# Patient Record
Sex: Female | Born: 1990 | Race: White | Hispanic: No | Marital: Single | Smoking: Former smoker
Health system: Southern US, Community
[De-identification: ages and names within clinical notes are randomized; demographics above are authoritative.]

## PROBLEM LIST (undated history)

## (undated) ENCOUNTER — Inpatient Hospital Stay: Payer: Self-pay

## (undated) DIAGNOSIS — Z9189 Other specified personal risk factors, not elsewhere classified: Secondary | ICD-10-CM

## (undated) DIAGNOSIS — F419 Anxiety disorder, unspecified: Secondary | ICD-10-CM

## (undated) HISTORY — PX: TUBAL LIGATION: SHX77

---
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
## 2012-01-02 LAB — PATHOLOGY REPORT

## 2012-03-15 ENCOUNTER — Observation Stay: Payer: Self-pay

## 2012-03-15 LAB — URINALYSIS, COMPLETE
Ketone: NEGATIVE
Nitrite: NEGATIVE
Ph: 8 (ref 4.5–8.0)
Protein: NEGATIVE
RBC,UR: 5 /HPF (ref 0–5)
Specific Gravity: 1.008 (ref 1.003–1.030)
WBC UR: 6 /HPF (ref 0–5)

## 2012-03-17 LAB — URINE CULTURE

## 2012-05-25 ENCOUNTER — Inpatient Hospital Stay: Payer: Self-pay | Admitting: Internal Medicine

## 2012-05-25 LAB — CBC WITH DIFFERENTIAL/PLATELET
Basophil %: 0.6 %
Lymphocyte %: 22 %
MCV: 88 fL (ref 80–100)
Monocyte %: 4.5 %
Neutrophil #: 11.6 10*3/uL — ABNORMAL HIGH (ref 1.4–6.5)
Neutrophil %: 72.6 %
Platelet: 120 10*3/uL — ABNORMAL LOW (ref 150–440)
RBC: 4.06 10*6/uL (ref 3.80–5.20)
RDW: 12.9 % (ref 11.5–14.5)
WBC: 16 10*3/uL — ABNORMAL HIGH (ref 3.6–11.0)

## 2014-12-03 NOTE — Op Note (Signed)
PATIENT NAME:  Tracie Meyer, Tracie Meyer MR#:  [id number] DATE OF BIRTH:  March 31, 1991  DATE OF PROCEDURE:  12/31/2011  PREOPERATIVE DIAGNOSIS: Acute appendicitis.   POSTOPERATIVE DIAGNOSIS: Normal appendix.   OPERATION PERFORMED: Laparoscopic appendectomy.   SURGEON: Claude MangesWilliam F. La Dibella, MD  PROCEDURE IN DETAIL: The patient was placed supine and in slight head down position and slight rotation to the right position as she was [redacted] weeks pregnant and she was prepped and draped in the usual sterile fashion. A 1 inch midline incision was made in the epigastrium and this was carried down through the linea alba and then the peritoneum was entered carefully. A Hassan cannula was introduced amidst horizontal mattress sutures of 0 Vicryl and a 12 mmHg CO2 pneumoperitoneum was created. Two additional 5 mm trocars were placed under direct visualization. The uterus was gravid and the appendix was found and appeared completely normal. The mesoappendix was taken down with the Harmonic scalpel and the appendectomy was performed with an Endo GIA stapler. The appendix was placed in an Endo Catch bag and extracted from the abdomen via the epigastric port. There was a small amount of bleeding from the appendicular artery where it had been stapled and this was controlled with two large hemoclips. Another cause for the patient's abdominal pain was searched for and the gallbladder appeared normal. The duodenum appeared normal. The small intestine was run from the ileocecal valve back at least 3 or 4 feet and it appeared normal other than the fact that the terminal ileum was more dilated than usual. There was certainly no Meckel's diverticulum or other pathologies such as Crohn's disease or creeping fat. The uterus was gravid and both ovaries were identified and both were normal; neither were torsed and there was no evidence of salpingitis. Therefore a cause for the patient's abdominal pain was not found. The peritoneum was desufflated and  decannulated and the linea alba was closed with a running 0 PDS suture and the previously placed Vicryls were tied one to another. All three skin sites were closed with subcuticular 5-0 Monocryl and suture strips. The patient tolerated the procedure well. There were no complications. ____________________________ Claude MangesWilliam F. Jakya Dovidio, MD wfm:cms D: 12/31/2011 10:26:08 ET T: 12/31/2011 11:21:32 ET  JOB#: [id number] Claude MangesWILLIAM F Sherlock Nancarrow MD ELECTRONICALLY SIGNED 01/01/2012 7:54

## 2014-12-03 NOTE — H&P (Signed)
Subjective/Chief Complaint nausea and ap    History of Present Illness 24 year old female at [redacted] weeks EGA with first pregnancy awoke this am with nausea and emesis of 6 times,  developed acute onset rlq pain around 7 pm.  Work-up in ER with abdominal US and OB US unremarkable.  WBC 13K lytes ok. UA negative    Past History none    Primary Physician [facility]   Past Med/Surgical Hx:  None, patient reports no medical history.:   None, patient reports no surgical history.:   ALLERGIES:  No Known Allergies:   HOME MEDICATIONS: Medication Instructions Status  Prenatal 1 oral capsule 1 cap(s) orally once a day Active   Family and Social History:   Family History Non-Contributory    Social History negative tobacco, negative ETOH   Review of Systems:   Subjective/Chief Complaint see above    Abdominal Pain Yes    Tolerating Diet No  Nauseated  Vomiting  hungry.    Medications/Allergies Reviewed Medications/Allergies reviewed   Physical Exam:   GEN no acute distress, thin    HEENT pale conjunctivae    NECK supple    RESP normal resp effort    CARD regular rate    ABD positive tenderness  no hernia  soft  rovsing's sign present, tender in RLQ.    LYMPH negative neck    EXTR negative cyanosis/clubbing    SKIN normal to palpation    NEURO cranial nerves intact    PSYCH A+O to time, place, person   Routine Chem:  21-May-13 19:08    Glucose, Serum 77   BUN 10   Creatinine (comp) 0.51   Sodium, Serum 137   Potassium, Serum 3.7   Chloride, Serum 105   CO2, Serum 20   Calcium (Total), Serum 9.4  Hepatic:  21-May-13 19:08    Bilirubin, Total 0.5   Alkaline Phosphatase 55   SGPT (ALT) 22   SGOT (AST) 20   Total Protein, Serum 7.2   Albumin, Serum 3.7  Routine Chem:  21-May-13 19:08    Osmolality (calc) 272   eGFR (African American) >60   eGFR (Non-African American) >60   Anion Gap 12  Routine Hem:  21-May-13 19:08    WBC (CBC) 13.6   RBC  (CBC) 4.10   Hemoglobin (CBC) 12.3   Hematocrit (CBC) 37.2   Platelet Count (CBC) 158   MCV 91   MCH 30.0   MCHC 33.1   RDW 13.1  Routine Chem:  21-May-13 19:08    Lipase 569   HCG Betasubunit Quant. Serum 26445  Routine UA:  21-May-13 22:26    Color (UA) Yellow   Clarity (UA) Hazy   Glucose (UA) Negative   Bilirubin (UA) Negative   Ketones (UA) 2+   Specific Gravity (UA) 1.021   Blood (UA) Negative   pH (UA) 7.0   Protein (UA) Negative   Nitrite (UA) Negative   Leukocyte Esterase (UA) Negative   RBC (UA) 1 /HPF   WBC (UA) <1 /HPF   Epithelial Cells (UA) 2 /HPF   Mucous (UA) PRESENT   Radiology Results: [place]:    21-May-13 21:48, US Abdomen General Survey   US Abdomen General Survey   REASON FOR EXAM:    right sided abdominal pain  COMMENTS:   May transport without cardiac monitor    PROCEDURE: [place]  - US ABDOMEN GENERAL SURVEY  - Dec 30 2011  9:48PM     RESULT: The  liver exhibits normal echotexture with no focal mass nor   ductal dilation. Portal venous flow is normal in direction toward the   liver. The gallbladder is adequately distended with no evidence of   stones, wall thickening, or pericholecystic fluid. The common bile duct   is normal at just under 2 mm in diameter. The pancreatic head and body   are normal in appearance. The tail could not be demonstrated due to the   presence of bowel gas.    The spleen, abdominal aorta, inferior vena cava, and kidneys are normal   in appearance. There is no hydronephrosis. No ascites is demonstrated.  IMPRESSION:  Normal abdominal ultrasound examination.          Verified By: DAVID A. Martinique, M.D., MD    21-May-13 22:30, US OB Greater or Equal to 14 weeks   US OB Greater or Equal to 14 weeks   REASON FOR EXAM:    abd pain  COMMENTS:       PROCEDURE: [place]  - US OB GREATER/OR EQUAL TO 14WKS  - Dec 30 2011 10:30PM     RESULT: There is a gravid uterus present. The presentation of the fetus   is variable/breech. The  placenta is anterior and partially fundal. The   distance from the inferior margin of the placenta to the internal   cervical os is 6.9 cm. The fetal cardiac rate of 160 beats per minute was   demonstrated. A 4 chambered heart was demonstrated. The fetal stomach,   kidneys, and urinary bladder are normal in appearance. The intracranial   structures and the spinal structures are normal in appearance.    Measured parameters:  BPD 3.59 cm corresponding to an EGA of [redacted] weeks 0 days  HC 13.66 cm corresponding to an EGA of [redacted] weeks one day  AC 11.47 cm corresponding to an EGA of [redacted] weeks 2 days  FL 2.30 cm corresponding to an EGA of [redacted] weeks 6 days  HL 2.23 cm corresponding to an EGA of [redacted] weeks 6 days  The estimated fetal weight is 191 grams +/- 27 grams.    IMPRESSION:  There is a viable IUP with estimated gestational age of [redacted]   weeks 0 days +/- 12 days. The estimated date of confinement is 08 June 2012. This is in good agreement with the patient's clinical dating. No   fetal anomalies are demonstrated.          Verified By: DAVID A. Martinique, M.D., MD     Assessment/Admission Diagnosis 24 year old female in 2nd tri-mester with acute onset abdominal pain in rlq,  not convinced this is appendicitis at this time    Plan admit, hydrate, IV mefoxitin, repeat cbc and abdominal exam in next 6-8 hours.  discussed with patient and boyfriend in detail.   Electronic Signatures: Sherri Rad (MD)  (Signed 21-May-13 23:40)  Authored: CHIEF COMPLAINT and HISTORY, PAST MEDICAL/SURGIAL HISTORY, ALLERGIES, HOME MEDICATIONS, FAMILY AND SOCIAL HISTORY, REVIEW OF SYSTEMS, PHYSICAL EXAM, LABS, Radiology, ASSESSMENT AND PLAN   Last Updated: 21-May-13 23:40 by Sherri Rad (MD)

## 2014-12-19 NOTE — H&P (Signed)
L&D Evaluation:  History Expanded:   HPI 24 yo G1 at 27 weeks presents with c/o suprapubic pressure. [facility] No prenatal records available currently. History notable for appendectomy around [redacted] weeks gestation. Denies VF, LOF or contractions. +FM    Patient's Medical History No Chronic Illness    Patient's Surgical History Appendectomy    Medications Pre Natal Vitamins    Allergies NKDA   ROS:   ROS see HPI   Exam:   Vital Signs stable    Urine Protein UA: 3+ leuk, 19 Epi, 6 WBC, Trace bacteria, neg nitrite, neg ketones, neg blood    General tearful    Mental Status clear    Chest clear    Heart no murmur/gallop/rubs    Abdomen gravid, non-tender, except tender suprapubic area    Edema no edema    Pelvic no external lesions, cervix closed and thick    Mebranes Intact    FHT appropriate for gestational age, very active fetus    Ucx absent    Skin dry   Impression:   Impression IUP at 27 weeks with suprapubic pain   Plan:   Comments Kidney/bladder US Macrobid - give 1 dose tonight, remainder of medication per Rx    Follow Up Appointment already scheduled. 8/12   Electronic Signatures: Vella KohlerBrothers, Ieisha Gao K (CNM)  (Signed 05-Aug-13 19:46)  Authored: L&D Evaluation   Last Updated: 05-Aug-13 19:46 by Vella KohlerBrothers, Natina Wiginton K (CNM)

## 2015-03-29 ENCOUNTER — Encounter: Payer: Medicaid Other | Admitting: Obstetrics and Gynecology

## 2015-08-12 NOTE — L&D Delivery Note (Signed)
Delivery Note At 11:55 PM a viable female was delivered via Vaginal, Spontaneous Delivery (Presentation: OA).  APGAR: 9, 9; weight  pending.   Placenta status: delivered spontaneous, intact.  Cord: 3vc with the following complications: none.  Cord pH: n/a  Anesthesia:  epidural Episiotomy: None Lacerations: None Suture Repair: n/a Est. Blood Loss (mL): 400  Mom to postpartum.  Baby to Couplet care / Skin to Skin.  Called to see patient.  Mom pushed to deliver a viable female infant.  The head followed by shoulders, which delivered without difficulty, and the rest of the body.  A single, tight, nuchal cord noted.  Baby to mom's chest.  Cord clamped and cut after > 1 min delay.  Cord blood obtained.  Placenta delivered spontaneously, intact, with a 3-vessel cord.  No vaginal, cervical, or perineal lacerations.  All counts correct.  Hemostasis obtained with IV pitocin and fundal massage. EBL 400 mL.    Thomasene MohairStephen Tayte Childers, MD 05/30/2016 12:44 AM

## 2015-08-16 ENCOUNTER — Encounter: Payer: Self-pay | Admitting: Obstetrics and Gynecology

## 2015-08-16 ENCOUNTER — Ambulatory Visit (INDEPENDENT_AMBULATORY_CARE_PROVIDER_SITE_OTHER): Payer: Medicaid Other | Admitting: Obstetrics and Gynecology

## 2015-08-16 VITALS — BP 107/50 | HR 81 | Ht 67.0 in | Wt 107.7 lb

## 2015-08-16 DIAGNOSIS — Z30013 Encounter for initial prescription of injectable contraceptive: Secondary | ICD-10-CM

## 2015-08-16 DIAGNOSIS — Z720 Tobacco use: Secondary | ICD-10-CM | POA: Diagnosis not present

## 2015-08-16 DIAGNOSIS — Z124 Encounter for screening for malignant neoplasm of cervix: Secondary | ICD-10-CM | POA: Diagnosis not present

## 2015-08-16 DIAGNOSIS — Z01419 Encounter for gynecological examination (general) (routine) without abnormal findings: Secondary | ICD-10-CM | POA: Diagnosis not present

## 2015-08-16 MED ORDER — MEDROXYPROGESTERONE ACETATE 150 MG/ML IM SUSP: 150.0000 mg | INTRAMUSCULAR | Status: DC

## 2015-08-16 NOTE — Patient Instructions (Signed)
RE: MyChart  Dear Ms. Goodness  We are excited to introduce MyChart, a new best-in-class service that provides you online access to important information in your electronic medical record. We want to make it easier for you to view your health information - all in one secure location - when and where you need it. We expect MyChart will enhance the quality of care and service we provide. Use the activation code below to enroll in MyChart online at [email or web address]  When you register for MyChart, you can:  Marland Kitchen View your test results. . Communicate securely with your physician's office.  . View your medical history, allergies, medications, and immunizations. . Conveniently print information such as your medication lists.  If you are age 23 or older and want a member of your family to have access to your record, you must provide written consent by completing a proxy form available at our facility. Please speak to our clinical staff about guidelines regarding accounts for patients younger than age 23.  As you activate your MyChart account and need any technical assistance, please call the MyChart technical support line at [phone]) or email your question to [email or web address]. If you email your question(s), please include your name, a return phone number and the best time to reach you.  Thank you for using MyChart as your new health and wellness resource!  MyChart Activation Code:  [email or web address] Expires: 09/14/2015 11:20 AM          Health Maintenance, Female Adopting a healthy lifestyle and getting preventive care can go a long way to promote health and wellness. Talk with your health care provider about what schedule of regular examinations is right for you. This is a good chance for you to check in with your provider about disease prevention and staying healthy. In between checkups, there are plenty of things you can do on your own. Experts have done  a lot of research about which lifestyle changes and preventive measures are most likely to keep you healthy. Ask your health care provider for more information. WEIGHT AND DIET  Eat a healthy diet  Be sure to include plenty of vegetables, fruits, low-fat dairy products, and lean protein.  Do not eat a lot of foods high in solid fats, added sugars, or salt.  Get regular exercise. This is one of the most important things you can do for your health.  Most adults should exercise for at least 150 minutes each week. The exercise should increase your heart rate and make you sweat (moderate-intensity exercise).  Most adults should also do strengthening exercises at least twice a week. This is in addition to the moderate-intensity exercise.  Maintain a healthy weight  Body mass index (BMI) is a measurement that can be used to identify possible weight problems. It estimates body fat based on height and weight. Your health care provider can help determine your BMI and help you achieve or maintain a healthy weight.  For females 23 years of age and older:   A BMI below 18.5 is considered underweight.  A BMI of 18.5 to 24.9 is normal.  A BMI of 25 to 29.9 is considered overweight.  A BMI of 30 and above is considered obese.  Watch levels of cholesterol and blood lipids  You should start having your blood tested for lipids and cholesterol at 23 years of age, then have this test every 5 years.  You may need to have your cholesterol levels checked more often if:  Your lipid  or cholesterol levels are high.  You are older than 23 years of age.  You are at high risk for heart disease.  CANCER SCREENING   Lung Cancer  Lung cancer screening is recommended for adults 23-40 years old who are at high risk for lung cancer because of a history of smoking.  A yearly low-dose CT scan of the lungs is recommended for people who:  Currently smoke.  Have quit within the past 15 years.  Have at  least a 30-pack-year history of smoking. A pack year is smoking an average of one pack of cigarettes a day for 1 year.  Yearly screening should continue until it has been 15 years since you quit.  Yearly screening should stop if you develop a health problem that would prevent you from having lung cancer treatment.  Breast Cancer  Practice breast self-awareness. This means understanding how your breasts normally appear and feel.  It also means doing regular breast self-exams. Let your health care provider know about any changes, no matter how small.  If you are in your 23s or 30s, you should have a clinical breast exam (CBE) by a health care provider every 1-3 years as part of a regular health exam.  If you are 23 or older, have a CBE every year. Also consider having a breast X-ray (mammogram) every year.  If you have a family history of breast cancer, talk to your health care provider about genetic screening.  If you are at high risk for breast cancer, talk to your health care provider about having an MRI and a mammogram every year.  Breast cancer gene (BRCA) assessment is recommended for women who have family members with BRCA-related cancers. BRCA-related cancers include:  Breast.  Ovarian.  Tubal.  Peritoneal cancers.  Results of the assessment will determine the need for genetic counseling and BRCA1 and BRCA2 testing. Cervical Cancer Your health care provider may recommend that you be screened regularly for cancer of the pelvic organs (ovaries, uterus, and vagina). This screening involves a pelvic examination, including checking for microscopic changes to the surface of your cervix (Pap test). You may be encouraged to have this screening done every 3 years, beginning at age 23.  For women ages 23-65, health care providers may recommend pelvic exams and Pap testing every 3 years, or they may recommend the Pap and pelvic exam, combined with testing for human papilloma virus (HPV),  every 5 years. Some types of HPV increase your risk of cervical cancer. Testing for HPV may also be done on women of any age with unclear Pap test results.  Other health care providers may not recommend any screening for nonpregnant women who are considered low risk for pelvic cancer and who do not have symptoms. Ask your health care provider if a screening pelvic exam is right for you.  If you have had past treatment for cervical cancer or a condition that could lead to cancer, you need Pap tests and screening for cancer for at least 20 years after your treatment. If Pap tests have been discontinued, your risk factors (such as having a new sexual partner) need to be reassessed to determine if screening should resume. Some women have medical problems that increase the chance of getting cervical cancer. In these cases, your health care provider may recommend more frequent screening and Pap tests. Colorectal Cancer  This type of cancer can be detected and often prevented.  Routine colorectal cancer screening usually begins at 22 years of age  and continues through 23 years of age.  Your health care provider may recommend screening at an earlier age if you have risk factors for colon cancer.  Your health care provider may also recommend using home test kits to check for hidden blood in the stool.  A small camera at the end of a tube can be used to examine your colon directly (sigmoidoscopy or colonoscopy). This is done to check for the earliest forms of colorectal cancer.  Routine screening usually begins at age 84.  Direct examination of the colon should be repeated every 5-10 years through 23 years of age. However, you may need to be screened more often if early forms of precancerous polyps or small growths are found. Skin Cancer  Check your skin from head to toe regularly.  Tell your health care provider about any new moles or changes in moles, especially if there is a change in a mole's shape  or color.  Also tell your health care provider if you have a mole that is larger than the size of a pencil eraser.  Always use sunscreen. Apply sunscreen liberally and repeatedly throughout the day.  Protect yourself by wearing long sleeves, pants, a wide-brimmed hat, and sunglasses whenever you are outside. HEART DISEASE, DIABETES, AND HIGH BLOOD PRESSURE   High blood pressure causes heart disease and increases the risk of stroke. High blood pressure is more likely to develop in:  People who have blood pressure in the high end of the normal range (130-139/85-89 mm Hg).  People who are overweight or obese.  People who are African American.  If you are 76-69 years of age, have your blood pressure checked every 3-5 years. If you are 29 years of age or older, have your blood pressure checked every year. You should have your blood pressure measured twice--once when you are at a hospital or clinic, and once when you are not at a hospital or clinic. Record the average of the two measurements. To check your blood pressure when you are not at a hospital or clinic, you can use:  An automated blood pressure machine at a pharmacy.  A home blood pressure monitor.  If you are between 57 years and 42 years old, ask your health care provider if you should take aspirin to prevent strokes.  Have regular diabetes screenings. This involves taking a blood sample to check your fasting blood sugar level.  If you are at a normal weight and have a low risk for diabetes, have this test once every three years after 23 years of age.  If you are overweight and have a high risk for diabetes, consider being tested at a younger age or more often. PREVENTING INFECTION  Hepatitis B  If you have a higher risk for hepatitis B, you should be screened for this virus. You are considered at high risk for hepatitis B if:  You were born in a country where hepatitis B is common. Ask your health care provider which  countries are considered high risk.  Your parents were born in a high-risk country, and you have not been immunized against hepatitis B (hepatitis B vaccine).  You have HIV or AIDS.  You use needles to inject street drugs.  You live with someone who has hepatitis B.  You have had sex with someone who has hepatitis B.  You get hemodialysis treatment.  You take certain medicines for conditions, including cancer, organ transplantation, and autoimmune conditions. Hepatitis C  Blood testing is recommended  for:  Everyone born from [birth year] through 1963.  Anyone with known risk factors for hepatitis C. Sexually transmitted infections (STIs)  You should be screened for sexually transmitted infections (STIs) including gonorrhea and chlamydia if:  You are sexually active and are younger than 23 years of age.  You are older than 23 years of age and your health care provider tells you that you are at risk for this type of infection.  Your sexual activity has changed since you were last screened and you are at an increased risk for chlamydia or gonorrhea. Ask your health care provider if you are at risk.  If you do not have HIV, but are at risk, it may be recommended that you take a prescription medicine daily to prevent HIV infection. This is called pre-exposure prophylaxis (PrEP). You are considered at risk if:  You are sexually active and do not regularly use condoms or know the HIV status of your partner(s).  You take drugs by injection.  You are sexually active with a partner who has HIV. Talk with your health care provider about whether you are at high risk of being infected with HIV. If you choose to begin PrEP, you should first be tested for HIV. You should then be tested every 3 months for as long as you are taking PrEP.  PREGNANCY   If you are premenopausal and you may become pregnant, ask your health care provider about preconception counseling.  If you may become pregnant, take  400 to 800 micrograms (mcg) of folic acid every day.  If you want to prevent pregnancy, talk to your health care provider about birth control (contraception). OSTEOPOROSIS AND MENOPAUSE   Osteoporosis is a disease in which the bones lose minerals and strength with aging. This can result in serious bone fractures. Your risk for osteoporosis can be identified using a bone density scan.  If you are 72 years of age or older, or if you are at risk for osteoporosis and fractures, ask your health care provider if you should be screened.  Ask your health care provider whether you should take a calcium or vitamin D supplement to lower your risk for osteoporosis.  Menopause may have certain physical symptoms and risks.  Hormone replacement therapy may reduce some of these symptoms and risks. Talk to your health care provider about whether hormone replacement therapy is right for you.  HOME CARE INSTRUCTIONS   Schedule regular health, dental, and eye exams.  Stay current with your immunizations.   Do not use any tobacco products including cigarettes, chewing tobacco, or electronic cigarettes.  If you are pregnant, do not drink alcohol.  If you are breastfeeding, limit how much and how often you drink alcohol.  Limit alcohol intake to no more than 1 drink per day for nonpregnant women. One drink equals 12 ounces of beer, 5 ounces of wine, or 1 ounces of hard liquor.  Do not use street drugs.  Do not share needles.  Ask your health care provider for help if you need support or information about quitting drugs.  Tell your health care provider if you often feel depressed.  Tell your health care provider if you have ever been abused or do not feel safe at home.   This information is not intended to replace advice given to you by your health care provider. Make sure you discuss any questions you have with your health care provider.   Document Released: 02/10/2011 Document Revised:  08/18/2014 Document Reviewed: 06/29/2013  Elsevier Interactive Patient Education [employer]. HPV (Human Papillomavirus) Vaccine--Gardasil-9:  1. Why get vaccinated? Gardasil-9 prevents human papillomavirus (HPV) types that cause many cancers, including:  cervical cancer in females,  vaginal and vulvar cancers in females,  anal cancer in females and males,  throat cancer in females and males, and  penile cancer in males. In addition, Gardasil-9 prevents HPV types that cause genital warts in both females and males. In the [place] about 12,000 women get cervical cancer every year, and about 4,000 women die from it. Gaylyn Lambert can prevent most of these cases of cervical cancer. Vaccination is not a substitute for cervical cancer screening. This vaccine does not protect against all HPV types that can cause cervical cancer. Women should still get regular Pap tests. HPV infection usually comes from sexual contact, and most people will become infected at some point in their life. About 14 million Americans, including teens, get infected every year. Most infections will go away and not cause serious problems. But thousands of women and men get cancer and diseases from HPV. 2. HPV vaccine Gaylyn Lambert is an FDA-approved HPV vaccine. It is recommended for both males and females. It is routinely given at 51 or 23 years of age, but it may be given beginning at age 18 years through age 40 years. Three doses of Gardasil-9 are recommended with the second dose given 1-2 months after the first dose and the third dose given 6 months after the first dose. 3. Some people should not get this vaccine  Anyone who has had a severe, life-threatening allergic reaction to a dose of HPV vaccine should not get another dose.  Anyone who has a severe (life threatening) allergy to any component of HPV vaccine should not get the vaccine. Tell your doctor if you have any severe allergies that you know of, including a  severe allergy to yeast.  HPV vaccine is not recommended for pregnant women. If you learn that you were pregnant when you were vaccinated, there is no reason to expect any problems for you or your baby. Any woman who learns she was pregnant when she got Gardasil-9 vaccine is encouraged to contact the manufacturer's registry for HPV vaccination during pregnancy at [phone]. Women who are breastfeeding may be vaccinated.  If you have a mild illness, such as a cold, you can probably get the vaccine today. If you are moderately or severely ill, you should probably wait until you recover. Your doctor can advise you. 4. Risks of a vaccine reaction With any medicine, including vaccines, there is a chance of side effects. These are usually mild and go away on their own, but serious reactions are also possible. Most people who get HPV vaccine do not have any serious problems with it. Mild or moderate problems following Gardasil-9:  Reactions in the arm where the shot was given:  Soreness (about 9 people in 10)  Redness or swelling (about 1 person in 3)  Fever:  Mild (100F) (about 1 person in 10)  Moderate (102F) (about 1 person in 48)  Other problems:  Headache (about 1 person in 3) Problems that could happen after any injected vaccine:  People sometimes faint after a medical procedure, including vaccination. Sitting or lying down for about 15 minutes can help prevent fainting, and injuries caused by a fall. Tell your doctor if you feel dizzy, or have vision changes or ringing in the ears.  Some people get severe pain in the shoulder and have difficulty moving the arm  where a shot was given. This happens very rarely.  Any medication can cause a severe allergic reaction. Such reactions from a vaccine are very rare, estimated at about 1 in a million doses, and would happen within a few minutes to a few hours after the vaccination. As with any medicine, there is a very remote chance of a  vaccine causing a serious injury or death. The safety of vaccines is always being monitored. For more information, visit: http://www.aguilar.org/. 5. What if there is a serious reaction? What should I look for? Look for anything that concerns you, such as signs of a severe allergic reaction, very high fever, or unusual behavior. Signs of a severe allergic reaction can include hives, swelling of the face and throat, difficulty breathing, a fast heartbeat, dizziness, and weakness. These would usually start a few minutes to a few hours after the vaccination. What should I do? If you think it is a severe allergic reaction or other emergency that can't wait, call 9-1-1 or get to the nearest hospital. Otherwise, call your doctor. Afterward, the reaction should be reported to the "Vaccine Adverse Event Reporting System" (VAERS). Your doctor might file this report, or you can do it yourself through the VAERS web site at www.vaers.SamedayNews.es, or by calling 857-531-6361. VAERS does not give medical advice. 6. The National Vaccine Injury Compensation Program The [employer] Vaccine Injury Compensation Program (VICP) is a federal program that was created to compensate people who may have been injured by certain vaccines. Persons who believe they may have been injured by a vaccine can learn about the program and about filing a claim by calling [phone] or visiting the [facility] website at GoldCloset.com.ee. There is a time limit to file a claim for compensation. 7. How can I learn more?  Ask your health care provider. He or she can give you the vaccine package insert or suggest other sources of information.  Call your local or state health department.  Contact the Centers for Disease Control and Prevention (CDC):  Call 604-751-5625 (1-800-CDC-INFO) or  Visit CDC's website at http://sweeney-todd.com/ Vaccine Information Statement HPV Vaccine Gaylyn Lambert) 11/09/14   This information is not intended  to replace advice given to you by your health care provider. Make sure you discuss any questions you have with your health care provider.   Document Released: 02/22/2014 Document Revised: 12/12/2014 Document Reviewed: 02/22/2014 Elsevier Interactive Patient Education [employer].

## 2015-08-16 NOTE — Progress Notes (Addendum)
GYNECOLOGY ANNUAL EXAM CLINIC VISIT Subjective:    Tracie Meyer is a 25 y.o. G64P1001 female who presents to establish care and for an annual exam. Last seen at [facility] 3 years ago during pregnancy. The patient has no complaints today. The patient is sexually active (single opposite sex partner for 4 years) . GYN screening history: no prior history of gyn screening tests. The patient wears seatbelts: yes. The patient participates in regular exercise: no. Has the patient ever been transfused or tattooed?: no. The patient reports that there is not domestic violence in her life. The patient desires to discuss contraception.  Desires to discuss contraception today.   Menstrual History: Obstetric History   G1   P1   T1   P0   A0   TAB0   SAB0   E0   M0   L1     # Outcome Date GA Lbr Len/2nd Weight Sex Delivery Anes PTL Lv  1 Term 05/26/12   6 lb 12 oz (3.062 kg) M Vag-Spont EPI  Y      Menarche age: 20 Patient's last menstrual period was 07/27/2015. Period Duration (Days): 5-7 Period Pattern: Regular Menstrual Flow: Moderate Dysmenorrhea: (!) Mild Dysmenorrhea Symptoms: Cramping Denies h/o STIs.   History reviewed. No pertinent past medical history.  Past Surgical History  Procedure Laterality Date  . Appendectomy  2013   History reviewed. No pertinent family history.  Social History   Social History  . Marital Status: Single    Spouse Name: N/A  . Number of Children: N/A  . Years of Education: N/A   Occupational History  . Not on file.   Social History Main Topics  . Smoking status: Current Every Day Smoker -- 0.25 packs/day    Types: Cigarettes  . Smokeless tobacco: Not on file  . Alcohol Use: Yes     Comment: Social   . Drug Use: No  . Sexual Activity: Yes    Birth Control/ Protection: None   Other Topics Concern  . Not on file   Social History Narrative  . No narrative on file    No current outpatient prescriptions on file prior to visit.   No  current facility-administered medications on file prior to visit.    No Known Allergies   Review of Systems Constitutional: negative for chills, fatigue, fevers and sweats Eyes: negative for irritation, redness and visual disturbance Ears, nose, mouth, throat, and face: negative for hearing loss, nasal congestion, snoring and tinnitus Respiratory: negative for asthma, cough, sputum Cardiovascular: negative for chest pain, dyspnea, exertional chest pressure/discomfort, irregular heart beat, palpitations and syncope Gastrointestinal: negative for abdominal pain, change in bowel habits, nausea and vomiting Genitourinary: negative for abnormal menstrual periods, genital lesions, sexual problems and vaginal discharge, dysuria and urinary incontinence Integument/breast: negative for breast lump, breast tenderness and nipple discharge Hematologic/lymphatic: negative for bleeding and easy bruising Musculoskeletal:negative for back pain and muscle weakness Neurological: negative for dizziness, headaches, vertigo and weakness Endocrine: negative for diabetic symptoms including polydipsia, polyuria and skin dryness Allergic/Immunologic: negative for hay fever and urticaria    Objective:    BP 107/50 mmHg  Pulse 81  Ht 5\' 7"  (1.702 m)  Wt 107 lb 11.2 oz (48.852 kg)  BMI 16.86 kg/m2  LMP 07/27/2015.    General Appearance:    Alert, cooperative, no distress, appears stated age  Head:    Normocephalic, without obvious abnormality, atraumatic  Eyes:    PERRL, conjunctiva/corneas clear, EOM's intact, both eyes  Ears:    Normal external ear canals, both ears  Nose:   Nares normal, septum midline, mucosa normal, no drainage or sinus tenderness  Throat:   Lips, mucosa, and tongue normal; teeth and gums normal  Neck:   Supple, symmetrical, trachea midline, no adenopathy; thyroid: no enlargement/tenderness/nodules; no carotid bruit or JVD  Back:     Symmetric, no curvature, ROM normal, no CVA  tenderness  Lungs:     Clear to auscultation bilaterally, respirations unlabored  Chest Wall:    No tenderness or deformity   Heart:    Regular rate and rhythm, S1 and S2 normal, no murmur, rub or gallop  Breast Exam:    No tenderness, masses, or nipple abnormality  Abdomen:     Soft, non-tender, bowel sounds active all four quadrants, no masses, no organomegaly.   Genitalia:    Pelvic:external genitalia normal, vagina without lesions, discharge, or tenderness, rectovaginal septum  normal. Cervix normal in appearance, no cervical motion tenderness, no adnexal masses or tenderness.  Uterus normal size, shape, consistency, nontender.   Rectal:    Normal external sphincter.  No hemorrhoids appreciated. Internal exam not done.   Extremities:   Extremities normal, atraumatic, no cyanosis or edema  Pulses:   2+ and symmetric all extremities  Skin:   Skin color, texture, turgor normal, no rashes or lesions  Lymph nodes:   Cervical, supraclavicular, and axillary nodes normal  Neurologic:   CNII-XII intact, normal strength, sensation and reflexes throughout    Assessment:   Healthy female exam.   Underweight Contraception management Tobacco abuse Plan:     Blood tests: Basic metabolic panel and CBC with diff. Contraception: Depo-Provera injections.  Will prescribe. To return within the next week for injection.  Discussed healthy lifestyle modifications. Pap smear.   Screened for genitourinary STIs, declines serology.  Up to date with all vaccines, including flu vaccine.  Discussed HPV vaccine. Information given.  Tobacco use, counseled on smoking cessation.  Follow up in 3 months after 1st Depo injection, then yearly for annual exam.    Hildred LaserAnika Lemmie Steinhaus, MD [facility]

## 2015-08-17 LAB — CBC
Hematocrit: 38.6 % (ref 34.0–46.6)
Hemoglobin: 13.1 g/dL (ref 11.1–15.9)
MCH: 30.3 pg (ref 26.6–33.0)
MCHC: 33.9 g/dL (ref 31.5–35.7)
MCV: 89 fL (ref 79–97)
Platelets: 189 10*3/uL (ref 150–379)
RBC: 4.33 x10E6/uL (ref 3.77–5.28)
RDW: 12.6 % (ref 12.3–15.4)
WBC: 6.5 10*3/uL (ref 3.4–10.8)

## 2015-08-17 LAB — BASIC METABOLIC PANEL
BUN / CREAT RATIO: 21 — AB (ref 8–20)
BUN: 13 mg/dL (ref 6–20)
CO2: 22 mmol/L (ref 18–29)
Calcium: 9.3 mg/dL (ref 8.7–10.2)
Chloride: 102 mmol/L (ref 96–106)
Creatinine, Ser: 0.61 mg/dL (ref 0.57–1.00)
GFR calc Af Amer: 149 mL/min/{1.73_m2} (ref >59)
GFR, EST NON AFRICAN AMERICAN: 129 mL/min/{1.73_m2} (ref >59)
Glucose: 77 mg/dL (ref 65–99)
Potassium: 4.1 mmol/L (ref 3.5–5.2)
SODIUM: 142 mmol/L (ref 134–144)

## 2015-08-21 ENCOUNTER — Ambulatory Visit: Payer: Medicaid Other

## 2015-08-21 LAB — PAP IG, CT-NG, RFX HPV ASCU: PAP SMEAR COMMENT: 0

## 2015-08-24 ENCOUNTER — Telehealth: Payer: Self-pay

## 2015-08-24 NOTE — Telephone Encounter (Signed)
-----   Message from Hildred LaserAnika Cherry, MD sent at 08/23/2015  9:37 AM EST ----- Patient's pap smear negative. Can inform.

## 2015-08-24 NOTE — Telephone Encounter (Signed)
Pt informed of negative PAP. 

## 2015-09-03 ENCOUNTER — Ambulatory Visit: Payer: Medicaid Other

## 2015-10-17 ENCOUNTER — Emergency Department: Payer: Medicaid Other

## 2015-10-17 ENCOUNTER — Emergency Department
Admission: EM | Admit: 2015-10-17 | Discharge: 2015-10-17 | Disposition: A | Discharge status: Home / Self Care | Payer: Medicaid Other | Attending: Emergency Medicine | Admitting: Emergency Medicine

## 2015-10-17 DIAGNOSIS — B9689 Other specified bacterial agents as the cause of diseases classified elsewhere: Secondary | ICD-10-CM

## 2015-10-17 DIAGNOSIS — O23591 Infection of other part of genital tract in pregnancy, first trimester: Secondary | ICD-10-CM | POA: Insufficient documentation

## 2015-10-17 DIAGNOSIS — O209 Hemorrhage in early pregnancy, unspecified: Secondary | ICD-10-CM | POA: Diagnosis present

## 2015-10-17 DIAGNOSIS — Z79899 Other long term (current) drug therapy: Secondary | ICD-10-CM | POA: Insufficient documentation

## 2015-10-17 DIAGNOSIS — O99331 Smoking (tobacco) complicating pregnancy, first trimester: Secondary | ICD-10-CM | POA: Diagnosis not present

## 2015-10-17 DIAGNOSIS — Z3A08 8 weeks gestation of pregnancy: Secondary | ICD-10-CM | POA: Insufficient documentation

## 2015-10-17 DIAGNOSIS — O210 Mild hyperemesis gravidarum: Secondary | ICD-10-CM | POA: Diagnosis not present

## 2015-10-17 DIAGNOSIS — N72 Inflammatory disease of cervix uteri: Secondary | ICD-10-CM

## 2015-10-17 DIAGNOSIS — N760 Acute vaginitis: Secondary | ICD-10-CM

## 2015-10-17 DIAGNOSIS — F17210 Nicotine dependence, cigarettes, uncomplicated: Secondary | ICD-10-CM | POA: Insufficient documentation

## 2015-10-17 LAB — URINALYSIS COMPLETE WITH MICROSCOPIC (ARMC ONLY)
Bacteria, UA: NONE SEEN
Bilirubin Urine: NEGATIVE
GLUCOSE, UA: NEGATIVE mg/dL
Hgb urine dipstick: NEGATIVE
KETONES UR: NEGATIVE mg/dL
NITRITE: NEGATIVE
Protein, ur: NEGATIVE mg/dL
SPECIFIC GRAVITY, URINE: 1.01 (ref 1.005–1.030)
pH: 7 (ref 5.0–8.0)

## 2015-10-17 LAB — TYPE AND SCREEN
ABO/RH(D): O POS
Antibody Screen: NEGATIVE

## 2015-10-17 LAB — HCG, QUANTITATIVE, PREGNANCY: hCG, Beta Chain, Quant, S: 117052 m[IU]/mL — ABNORMAL HIGH (ref <5)

## 2015-10-17 LAB — WET PREP, GENITAL
Sperm: NONE SEEN
TRICH WET PREP: NONE SEEN
Yeast Wet Prep HPF POC: NONE SEEN

## 2015-10-17 LAB — CHLAMYDIA/NGC RT PCR (ARMC ONLY)
Chlamydia Tr: NOT DETECTED
N gonorrhoeae: NOT DETECTED

## 2015-10-17 LAB — ABO/RH: ABO/RH(D): O POS

## 2015-10-17 MED ORDER — METRONIDAZOLE 500 MG PO TABS
ORAL_TABLET | ORAL | Status: AC
  Administered 2015-10-17: 500 mg via ORAL
  Filled 2015-10-17: qty 1

## 2015-10-17 MED ORDER — CEFTRIAXONE SODIUM 250 MG IJ SOLR
250.0000 mg | Freq: Once | INTRAMUSCULAR | Status: AC
  Administered 2015-10-17: 250 mg via INTRAMUSCULAR

## 2015-10-17 MED ORDER — FENTANYL CITRATE (PF) 100 MCG/2ML IJ SOLN
50.0000 ug | Freq: Once | INTRAMUSCULAR | Status: AC
  Administered 2015-10-17: 50 ug via INTRAVENOUS

## 2015-10-17 MED ORDER — FENTANYL CITRATE (PF) 100 MCG/2ML IJ SOLN
INTRAMUSCULAR | Status: AC
  Administered 2015-10-17: 50 ug via INTRAVENOUS
  Filled 2015-10-17: qty 2

## 2015-10-17 MED ORDER — AZITHROMYCIN 250 MG PO TABS
1000.0000 mg | ORAL_TABLET | Freq: Once | ORAL | Status: AC
  Administered 2015-10-17: 1000 mg via ORAL

## 2015-10-17 MED ORDER — ONDANSETRON HCL 4 MG/2ML IJ SOLN
4.0000 mg | Freq: Once | INTRAMUSCULAR | Status: AC
  Administered 2015-10-17: 4 mg via INTRAVENOUS
  Filled 2015-10-17: qty 2

## 2015-10-17 MED ORDER — ONDANSETRON 4 MG PO TBDP: 4.0000 mg | ORAL_TABLET | Freq: Three times a day (TID) | ORAL | Status: DC | PRN

## 2015-10-17 MED ORDER — CEFTRIAXONE SODIUM 250 MG IJ SOLR
INTRAMUSCULAR | Status: AC
  Administered 2015-10-17: 250 mg via INTRAMUSCULAR
  Filled 2015-10-17: qty 250

## 2015-10-17 MED ORDER — SODIUM CHLORIDE 0.9 % IV BOLUS (SEPSIS)
1000.0000 mL | Freq: Once | INTRAVENOUS | Status: AC
  Administered 2015-10-17: 1000 mL via INTRAVENOUS

## 2015-10-17 MED ORDER — VITAMIN B-6 25 MG PO TABS: 25.0000 mg | ORAL_TABLET | Freq: Three times a day (TID) | ORAL | Status: DC | PRN

## 2015-10-17 MED ORDER — METRONIDAZOLE 500 MG PO TABS: 500.0000 mg | ORAL_TABLET | Freq: Two times a day (BID) | ORAL | Status: DC

## 2015-10-17 MED ORDER — ONDANSETRON 4 MG PO TBDP
ORAL_TABLET | ORAL | Status: AC
  Administered 2015-10-17: 4 mg via ORAL
  Filled 2015-10-17: qty 1

## 2015-10-17 MED ORDER — METRONIDAZOLE 500 MG PO TABS
500.0000 mg | ORAL_TABLET | Freq: Once | ORAL | Status: AC
  Administered 2015-10-17: 500 mg via ORAL

## 2015-10-17 MED ORDER — AZITHROMYCIN 250 MG PO TABS
ORAL_TABLET | ORAL | Status: AC
  Administered 2015-10-17: 1000 mg via ORAL
  Filled 2015-10-17: qty 4

## 2015-10-17 MED ORDER — ONDANSETRON 4 MG PO TBDP
4.0000 mg | ORAL_TABLET | Freq: Once | ORAL | Status: AC
  Administered 2015-10-17: 4 mg via ORAL

## 2015-10-17 NOTE — Discharge Instructions (Signed)
Please return to the emergency department if you develop bleeding, abdominal pain, lightheadedness or fainting, fever, or any other symptoms concerning to you.

## 2015-10-17 NOTE — ED Notes (Signed)
Pt reports is approximately [redacted] weeks pregnant and has had some abdominal pain for the past week and last pm started with spotting. Pt reports lots of vomiting. Pt unable to be seen by OB yet.

## 2015-10-17 NOTE — ED Notes (Signed)
Pt states abd cramping and some spotting that began last night, pt states she believes she is [redacted] weeks pregnant, this is pts 2nd pregnancy, family at bedside

## 2015-10-17 NOTE — ED Provider Notes (Signed)
[facility] Emergency Department Provider Note  ____________________________________________  Time seen: Approximately 2:14 PM  I have reviewed the triage vital signs and the nursing notes.   HISTORY  Chief Complaint Abdominal Pain; Vaginal Bleeding; and Possible Pregnancy    HPI Tracie Meyer is a 25 y.o. female G2 P1 approximately [redacted] weeks pregnant by LMP, without established OB care, presenting with vaginal bleeding, lower abdominal and low back pain. Patient states that for the past several weeks she's been having nausea and vomiting but this has gotten worse over the last several days. Last night she started having some vaginal spotting, and this morning she began to have associated severe abdominal cramping, as well as vulvar and coccyx pain "like you follow-up your bike onto the bar."Last sexual intercourse was several weeks ago. She denies any change in vaginal discharge, fever, diarrhea or constipation. Last menstrual cycle was in January, "but it was light spotting." Her period before that was in December which was "heavy bleeding."  OB history: No history of gonorrhea or chlamydia. Previous pregnancy without any complications except for laparoscopic appendectomy, "after they took it out, they told me that I didn't have appendicitis.."   No past medical history on file.  There are no active problems to display for this patient.   Past Surgical History  Procedure Laterality Date  . Appendectomy  2013    Current Outpatient Rx  Name  Route  Sig  Dispense  Refill  . medroxyPROGESTERone (DEPO-PROVERA) 150 MG/ML injection   Intramuscular   Inject 1 mL (150 mg total) into the muscle every 3 (three) months.   1 mL   4     Allergies Review of patient's allergies indicates no known allergies.  No family history on file.  Social History Social History  Substance Use Topics  . Smoking status: Current Every Day Smoker -- 0.25 packs/day    Types:  Cigarettes  . Smokeless tobacco: Not on file  . Alcohol Use: Yes     Comment: Social     Review of Systems Constitutional: No fever/chills. No lightheadedness or syncope. Eyes: No visual changes. ENT: No sore throat. Cardiovascular: Denies chest pain, palpitations. Respiratory: Denies shortness of breath.  No cough. Gastrointestinal: Positive suprapubic abdominal pain.  Positive nausea, positive vomiting.  No diarrhea.  No constipation. Genitourinary: Negative for dysuria. No change in vaginal discharge. Positive vaginal spotting. Musculoskeletal: Positive for low back and upper sacral back pain. Skin: Negative for rash. Neurological: Negative for headaches, focal weakness or numbness.  10-point ROS otherwise negative.  ____________________________________________   PHYSICAL EXAM:  VITAL SIGNS: ED Triage Vitals  Enc Vitals Group     BP 10/17/15 1100 110/55 mmHg     Pulse Rate 10/17/15 1100 78     Resp 10/17/15 1100 20     Temp 10/17/15 1100 98.4 F (36.9 C)     Temp Source 10/17/15 1100 Oral     SpO2 10/17/15 1100 100 %     Weight 10/17/15 1100 112 lb (50.803 kg)     Height 10/17/15 1100  (1.702 m)     Head Cir --      Peak Flow --      Pain Score 10/17/15 1101 6     Pain Loc --      Pain Edu? --      Excl. in GC? --     Constitutional: Patient is alert and oriented and answering questions properly. She is uncomfortable appearing but nontoxic.  Eyes: Conjunctivae are normal.  EOMI. no scleral icterus. Head: Atraumatic. Nose: No congestion/rhinnorhea. Mouth/Throat: Mucous membranes are moist.  Neck: No stridor.  Supple.   Cardiovascular: Normal rate, regular rhythm. No murmurs, rubs or gallops.  Respiratory: Normal respiratory effort.  No retractions. Lungs CTAB.  No wheezes, rales or ronchi. Gastrointestinal: Abdomen is soft and mildly distended. Patient has diffuse nonfocal tenderness to palpation in the upper and lower abdomen without a localizable area.  She has no peritoneal signs, guarding or rebound. She does have distention but I'm unable to palpate the top of her fundus. Insert Genitourinary: Normal-appearing external genitalia without lesions. Vaginal exam notable for thick white yellow discharge, normal-appearing cervix, normal vaginal wall tissue. No evidence of bleeding. Bimanual exam is positive for CMT, positive right adnexal tenderness to palpation, no palpable masses. Cervix is closed. Musculoskeletal: No LE edema.  Neurologic:  Normal speech and language. No gross focal neurologic deficits are appreciated.  Skin:  Skin is warm, dry and intact. No rash noted. Psychiatric: Mood and affect are normal. Speech and behavior are normal.  Normal judgement.  ____________________________________________   LABS (all labs ordered are listed, but only abnormal results are displayed)  Labs Reviewed  HCG, QUANTITATIVE, PREGNANCY - Abnormal; Notable for the following:    hCG, Beta Chain, Quant, Kathie RhodesS [id number] (*)    All other components within normal limits  URINALYSIS COMPLETEWITH MICROSCOPIC ([facility] ONLY) - Abnormal; Notable for the following:    Color, Urine YELLOW (*)    APPearance HAZY (*)    Leukocytes, UA 1+ (*)    Squamous Epithelial / LPF 6-30 (*)    All other components within normal limits  CHLAMYDIA/NGC RT PCR ([facility] ONLY)  WET PREP, GENITAL   ____________________________________________  EKG  Not indicated ____________________________________________  RADIOLOGY  No results found.  ____________________________________________   PROCEDURES  Procedure(s) performed: None  Critical Care performed: No ____________________________________________   INITIAL IMPRESSION / ASSESSMENT AND PLAN / ED COURSE  Pertinent labs & imaging results that were available during my care of the patient were reviewed by me and considered in my medical decision making (see chart for details).  25 y.o. G2P1 with LMP January 2017  presenting with vaginal spotting, and lower abdominal pain. Overall, the patient is uncomfortable appearing. We will evaluate her for threatened AB, rule out ectopic, and evaluate for any STI's.  ----------------------------------------- 3:33 PM on 10/17/2015 -----------------------------------------  Patient is Rh+; she did not have any bleeding evident on my examination. She does have clue cells and we will treat her for bacterial vaginosis. Given her positive CMT and right adnexal tenderness, I will treat her for cervicitis as well. Her ultrasound shows a viable IUP dated 7 weeks 6 days without any subchorionic hemorrhage. I spoke with Dr. Feliberto GottronSchermerhorn who will see her for follow up, and I'm planning discharge. The patient understands return precautions as well as follow up instructions.  ____________________________________________  FINAL CLINICAL IMPRESSION(S) / ED DIAGNOSES  Final diagnoses:  None      NEW MEDICATIONS STARTED DURING THIS VISIT:  New Prescriptions   No medications on file     Rockne MenghiniAnne-Caroline Donette Mainwaring, MD 10/17/15 1558

## 2016-02-05 ENCOUNTER — Observation Stay
Admission: EM | Admit: 2016-02-05 | Discharge: 2016-02-05 | Disposition: A | Discharge status: Home / Self Care | Payer: Medicaid Other | Attending: Obstetrics & Gynecology | Admitting: Obstetrics & Gynecology

## 2016-02-05 DIAGNOSIS — Z3A22 22 weeks gestation of pregnancy: Secondary | ICD-10-CM | POA: Diagnosis not present

## 2016-02-05 DIAGNOSIS — W108XXA Fall (on) (from) other stairs and steps, initial encounter: Secondary | ICD-10-CM

## 2016-02-05 DIAGNOSIS — W109XXA Fall (on) (from) unspecified stairs and steps, initial encounter: Secondary | ICD-10-CM | POA: Insufficient documentation

## 2016-02-05 DIAGNOSIS — M25559 Pain in unspecified hip: Secondary | ICD-10-CM | POA: Diagnosis not present

## 2016-02-05 DIAGNOSIS — O9989 Other specified diseases and conditions complicating pregnancy, childbirth and the puerperium: Secondary | ICD-10-CM | POA: Diagnosis present

## 2016-02-05 NOTE — Discharge Summary (Signed)
  See FPN 

## 2016-02-05 NOTE — Final Progress Note (Signed)
Physician Final Progress Note  Patient ID: Tracie Meyer MRN: [id number] DOB/AGE: 23/02/1991 23 y.o.  Admit date: 02/05/2016 Admitting provider: Nadara Mustardobert P Katrina Brosh, MD Discharge date: 02/05/2016   Admission Diagnoses: Fall on stairs  Discharge Diagnoses:  Principal Problem:   Fall (on) (from) other stairs and steps, initial encounter  Consults: None  Significant Findings/ Diagnostic Studies: Pt is a 25 yo G2P1 at [redacted] weeks EGA with fall yesterday on stairs, hurting hip and having aches today.  No abd trauma, no vag bleeding.  Good FM. Exam normal.  FHT 140s.  No ctxs.  Discharge Condition: good  Disposition: 01-Home or Self Care  Diet: Regular diet  Discharge Activity: Activity as tolerated     Medication List    STOP taking these medications        medroxyPROGESTERone 150 MG/ML injection  Commonly known as:  DEPO-PROVERA     metroNIDAZOLE 500 MG tablet  Commonly known as:  FLAGYL      TAKE these medications        ondansetron 4 MG disintegrating tablet  Commonly known as:  ZOFRAN-ODT  Take 1 tablet (4 mg total) by mouth every 8 (eight) hours as needed for nausea or vomiting.     prenatal multivitamin Tabs tablet  Take 1 tablet by mouth daily at 12 noon.     vitamin B-6 25 MG tablet  Commonly known as:  pyridOXINE  Take 1 tablet (25 mg total) by mouth every 8 (eight) hours as needed.           Follow-up Information    Follow up with Letitia Libraobert Paul Trevan Messman, MD. Go in 2 weeks.   Specialty:  Obstetrics and Gynecology   Why:  As Scheduled   Contact information:   [address] [place] [id number] [phone]       Total time spent taking care of this patient: 15 minutes  Signed: Letitia Libraobert Paul Maisha Bogen 02/05/2016, 4:14 PM

## 2016-02-05 NOTE — Discharge Instructions (Signed)
What Do I Need to Know About Injuries During Pregnancy? °Trauma is the most common cause of injury and death in pregnant women. This can also result in significant harm or death of the baby. °Your baby is protected in the womb (uterus) by a sac filled with fluid (amniotic sac). Your baby can be harmed if there is direct, high-impact trauma to your abdomen and pelvis. This type of trauma can result in tearing of your uterus, the placenta pulling away from the wall of the uterus (placenta abruption), or the amniotic sac breaking open (rupture of membranes). These injuries can decrease or stop the blood supply to your baby or cause you to go into labor earlier than expected. Minor falls and low-impact automobile accidents do not usually harm your baby, even if they do minimally harm you. °WHAT KIND OF INJURIES CAN AFFECT MY PREGNANCY? °The most common causes of injury or death to a baby include: °· Falls. Falls are more common in the second and third trimester of the pregnancy. Factors that increase your risk of falling include: °¨ Increase in your weight. °¨ The change in your center of gravity. °¨ Tripping over an object that cannot be seen. °¨ Increased looseness (laxity) of your ligaments resulting in less coordinated movements (you may feel clumsy). °¨ Falling during high-risk activities like horseback riding or skiing. °· Automobile accidents. It is important to wear your seat belt properly, with the lap belt below your abdomen, and always practice safe driving. °· Domestic violence or assault. °· Burns (fire or electrical). °The most common causes of injury or death to the pregnant woman include: °· Injuries that cause severe bleeding, shock, and loss of blood flow to major organs. °· Head and neck injuries that result in severe brain or spinal damage. °· Chest trauma that can cause direct injury to the heart and lungs or any injury that affects the area enclosed by the ribs. Trauma to this area can result in  cardiorespiratory arrest. °WHAT CAN I DO TO PROTECT MYSELF AND MY BABY FROM INJURY WHILE I AM PREGNANT? °· Remove slippery rugs and loose objects on the floor that increase your risk of tripping. °· Avoid walking on wet or slippery floors. °· Wear comfortable shoes that have a good grip on the sole. Do not wear high-heeled shoes. °· Always wear your seat belt properly, with the lap belt below your abdomen, and always practice safe driving. Do not ride on a motorcycle while pregnant. °· Do not participate in high-impact activities or sports. °· Avoid fires, starting fires, lifting heavy pots of boiling or hot liquids, and fixing electrical problems. °· Only take over-the-counter or prescription medicines for pain, fever, or discomfort as directed by your health care provider. °· Know your blood type and the father's blood type in case you develop vaginal bleeding or experience an injury for which a blood transfusion may be necessary. °· Call your local emergency services (911 in the [place] if you are a victim of domestic violence or assault. Spousal abuse can be a significant cause of trauma during pregnancy. For help and support, contact the National Domestic Violence Hotline. °WHEN SHOULD I SEEK IMMEDIATE MEDICAL CARE?  °· You fall on your abdomen or experience any high-force accident or injury. °· You have been assaulted (domestic or otherwise). °· You have been in a car accident. °· You develop vaginal bleeding. °· You develop fluid leaking from the vagina. °· You develop uterine contractions (pelvic cramping, pain, or significant low back   pain). °· You become weak or faint, or have uncontrolled vomiting after trauma. °· You had a serious burn. This includes burns to the face, neck, hands, or genitals, or burns greater than the size of your palm anywhere else. °· You develop neck stiffness or pain after a fall or from other trauma. °· You develop a headache or vision problems after a fall or from other  trauma. °· You do not feel the baby moving or the baby is not moving as much as before a fall or other trauma. °  °This information is not intended to replace advice given to you by your health care provider. Make sure you discuss any questions you have with your health care provider. °  °Document Released: 09/04/2004 Document Revised: 08/18/2014 Document Reviewed: 05/04/2013 °Elsevier Interactive Patient Education ©2016 [employer] ° °

## 2016-02-05 NOTE — OB Triage Note (Signed)
Patient Tracie Meyer, G2P1, came in today following a fall last night around 7pm. Patient feel down going up her stairs and landed on her left abdomen. Reports postive fetal movement and denies vaginal bleeding. Reports soreness on her right abdomen.

## 2016-02-05 NOTE — Discharge Summary (Signed)
Ms. Tracie Meyer, G2P1, discharged to home in stable condition. Discharge instructions given and patient verbalized she understood instructions.

## 2016-04-08 ENCOUNTER — Observation Stay
Admission: EM | Admit: 2016-04-08 | Discharge: 2016-04-08 | Disposition: A | Discharge status: Home / Self Care | Payer: Medicaid Other | Attending: Obstetrics and Gynecology | Admitting: Obstetrics and Gynecology

## 2016-04-08 ENCOUNTER — Encounter: Payer: Self-pay | Admitting: *Deleted

## 2016-04-08 DIAGNOSIS — Z3A32 32 weeks gestation of pregnancy: Secondary | ICD-10-CM | POA: Insufficient documentation

## 2016-04-08 DIAGNOSIS — W108XXA Fall (on) (from) other stairs and steps, initial encounter: Secondary | ICD-10-CM

## 2016-04-08 LAB — TYPE AND SCREEN
ABO/RH(D): O POS
ANTIBODY SCREEN: NEGATIVE

## 2016-04-08 LAB — CBC
HCT: 32.1 % — ABNORMAL LOW (ref 35.0–47.0)
HEMOGLOBIN: 11.4 g/dL — AB (ref 12.0–16.0)
MCH: 31.6 pg (ref 26.0–34.0)
MCHC: 35.6 g/dL (ref 32.0–36.0)
MCV: 88.9 fL (ref 80.0–100.0)
PLATELETS: 125 10*3/uL — AB (ref 150–440)
RBC: 3.61 MIL/uL — AB (ref 3.80–5.20)
RDW: 13.1 % (ref 11.5–14.5)
WBC: 10 10*3/uL (ref 3.6–11.0)

## 2016-04-08 LAB — URINE DRUG SCREEN, QUALITATIVE (ARMC ONLY)
AMPHETAMINES, UR SCREEN: NOT DETECTED
Barbiturates, Ur Screen: NOT DETECTED
Benzodiazepine, Ur Scrn: NOT DETECTED
COCAINE METABOLITE, UR ~~LOC~~: NOT DETECTED
Cannabinoid 50 Ng, Ur ~~LOC~~: NOT DETECTED
MDMA (Ecstasy)Ur Screen: NOT DETECTED
METHADONE SCREEN, URINE: NOT DETECTED
OPIATE, UR SCREEN: NOT DETECTED
PHENCYCLIDINE (PCP) UR S: NOT DETECTED
Tricyclic, Ur Screen: NOT DETECTED

## 2016-04-08 LAB — CHLAMYDIA/NGC RT PCR (ARMC ONLY)
Chlamydia Tr: NOT DETECTED
N GONORRHOEAE: NOT DETECTED

## 2016-04-08 LAB — FETAL FIBRONECTIN: FETAL FIBRONECTIN: NEGATIVE

## 2016-04-08 MED ORDER — TERBUTALINE SULFATE 1 MG/ML IJ SOLN
0.2500 mg | Freq: Once | INTRAMUSCULAR | Status: AC
  Administered 2016-04-08: 0.25 mg via SUBCUTANEOUS
  Filled 2016-04-08: qty 1

## 2016-04-08 MED ORDER — NIFEDIPINE 10 MG PO CAPS: 10.0000 mg | ORAL_CAPSULE | Freq: Four times a day (QID) | ORAL | Status: DC

## 2016-04-08 MED ORDER — BETAMETHASONE SOD PHOS & ACET 6 (3-3) MG/ML IJ SUSP: 12.0000 mg | Freq: Once | INTRAMUSCULAR | Status: DC

## 2016-04-08 MED ORDER — LACTATED RINGERS IV BOLUS (SEPSIS)
1000.0000 mL | Freq: Once | INTRAVENOUS | Status: AC
  Administered 2016-04-08: 1000 mL via INTRAVENOUS

## 2016-04-08 MED ORDER — BETAMETHASONE SOD PHOS & ACET 6 (3-3) MG/ML IJ SUSP
12.0000 mg | Freq: Once | INTRAMUSCULAR | Status: AC
  Administered 2016-04-08: 12 mg via INTRAMUSCULAR
  Filled 2016-04-08: qty 1

## 2016-04-08 MED ORDER — NIFEDIPINE 10 MG PO CAPS
10.0000 mg | ORAL_CAPSULE | ORAL | Status: DC | PRN
Start: 2016-04-08 — End: 2016-04-08
  Administered 2016-04-08: 10 mg via ORAL
  Filled 2016-04-08: qty 1

## 2016-04-08 MED ORDER — LACTATED RINGERS IV SOLN
INTRAVENOUS | Status: DC
  Administered 2016-04-08: 125 mL/h via INTRAVENOUS

## 2016-04-08 NOTE — Discharge Summary (Signed)
In error

## 2016-04-08 NOTE — Final Progress Note (Signed)
Physician Final Progress Note  Patient ID: Tracie Meyer MRN: [id number] DOB/AGE: 23/02/1991 23 y.o.  Admit date: 04/08/2016 Admitting provider: Vena AustriaAndreas Beckey Polkowski, MD Discharge date: 04/08/2016   Admission Diagnoses: Preterm labor  Discharge Diagnoses:  Active Problems:   Preterm labor in second trimester with preterm delivery in third trimester  No cervical change preterm contractions received BMZ x 1 next dose tomorrow  Consults: None  Significant Findings/ Diagnostic Studies:  Results for orders placed or performed during the hospital encounter of 04/08/16 (from the past 24 hour(s))  Type and screen [facility]     Status: None   Collection Time: 04/08/16  4:42 PM  Result Value Ref Range   ABO/RH(D) O POS    Antibody Screen NEG    Sample Expiration 04/11/2016   CBC on admission     Status: Abnormal   Collection Time: 04/08/16  4:42 PM  Result Value Ref Range   WBC 10.0 3.6 - 11.0 K/uL   RBC 3.61 (L) 3.80 - 5.20 MIL/uL   Hemoglobin 11.4 (L) 12.0 - 16.0 g/dL   HCT 64.432.1 (L) 03.435.0 - 74.247.0 %   MCV 88.9 80.0 - 100.0 fL   MCH 31.6 26.0 - 34.0 pg   MCHC 35.6 32.0 - 36.0 g/dL   RDW 59.513.1 63.811.5 - 75.614.5 %   Platelets 125 (L) 150 - 440 K/uL  Fetal fibronectin     Status: None   Collection Time: 04/08/16  4:43 PM  Result Value Ref Range   Fetal Fibronectin NEGATIVE NEGATIVE   Appearance, FETFIB CLEAR CLEAR  Urine Drug Screen, Qualitative ([facility] only)     Status: None   Collection Time: 04/08/16  4:43 PM  Result Value Ref Range   Tricyclic, Ur Screen NONE DETECTED NONE DETECTED   Amphetamines, Ur Screen NONE DETECTED NONE DETECTED   MDMA (Ecstasy)Ur Screen NONE DETECTED NONE DETECTED   Cocaine Metabolite,Ur [place] NONE DETECTED NONE DETECTED   Opiate, Ur Screen NONE DETECTED NONE DETECTED   Phencyclidine (PCP) Ur S NONE DETECTED NONE DETECTED   Cannabinoid 50 Ng, Ur [place] NONE DETECTED NONE DETECTED   Barbiturates, Ur Screen NONE DETECTED NONE DETECTED   Benzodiazepine, Ur Scrn NONE DETECTED NONE DETECTED   Methadone Scn, Ur NONE DETECTED NONE DETECTED  Chlamydia/NGC rt PCR ([facility] only)     Status: None   Collection Time: 04/08/16  4:43 PM  Result Value Ref Range   Specimen source GC/Chlam URINE, RANDOM    Chlamydia Tr NOT DETECTED NOT DETECTED   N gonorrhoeae NOT DETECTED NOT DETECTED    Procedures: NST - reactive  Discharge Condition: good  Disposition: 01-Home or Self Care  Diet: Regular diet  Discharge Activity: Activity as tolerated  Discharge Instructions    Discharge activity:  No Restrictions    Complete by:  As directed   Discharge diet:  No restrictions    Complete by:  As directed   Fetal Kick Count:  Lie on our left side for one hour after a meal, and count the number of times your baby kicks.  If it is less than 5 times, get up, move around and drink some juice.  Repeat the test 30 minutes later.  If it is still less than 5 kicks in an hour, notify your doctor.    Complete by:  As directed   No sexual activity restrictions    Complete by:  As directed   Notify physician for a general feeling that "something is not right"  Complete by:  As directed   Notify physician for increase or change in vaginal discharge    Complete by:  As directed   Notify physician for intestinal cramps, with or without diarrhea, sometimes described as "gas pain"    Complete by:  As directed   Notify physician for leaking of fluid    Complete by:  As directed   Notify physician for low, dull backache, unrelieved by heat or Tylenol    Complete by:  As directed   Notify physician for menstrual like cramps    Complete by:  As directed   Notify physician for pelvic pressure    Complete by:  As directed   Notify physician for uterine contractions.  These may be painless and feel like the uterus is tightening or the baby is  "balling up"    Complete by:  As directed   Notify physician for vaginal bleeding    Complete by:  As directed   PRETERM LABOR:   Includes any of the follwing symptoms that occur between 20 - [redacted] weeks gestation.  If these symptoms are not stopped, preterm labor can result in preterm delivery, placing your baby at risk    Complete by:  As directed       Medication List    TAKE these medications   ondansetron 4 MG disintegrating tablet Commonly known as:  ZOFRAN-ODT Take 1 tablet (4 mg total) by mouth every 8 (eight) hours as needed for nausea or vomiting.   prenatal multivitamin Tabs tablet Take 1 tablet by mouth daily at 12 noon.   vitamin B-6 25 MG tablet Commonly known as:  pyridOXINE Take 1 tablet (25 mg total) by mouth every 8 (eight) hours as needed.        Total time spent taking care of this patient:60 minutes  Signed: Lorrene Reid 04/08/2016, 10:54 PM

## 2016-04-08 NOTE — H&P (Signed)
Obstetric H&P   Chief Complaint: Contractions  Prenatal Care Provider: [facility]  History of Present Illness: 25 y.o. G2P1001 7324w3d by 9 week US derived EDC of 05/31/2016, presenting to L&D with contractions.  No LOF, no VB, +FM.  Prenatal care this far uncomplicated other than elevated 1-hr OGTT at 28 weeks with normal follow up 3-hr.  15lbs weight gain thus far this pregnancy  O pos / ABSC neg / RI / VZI / HBsAg neg / RPR NR / HIV neg / 1-hr 140 / 3-hr (70 / 85 / 63 / 36) / GBS unknown  Pelvis tested to 6lbs 12oz at 37 weeks.  TDAP received 03/28/2016  Review of Systems: 10 point review of systems negative unless otherwise noted in HPI  Past Medical History: No past medical history on file.  Past Surgical History: Past Surgical History:  Procedure Laterality Date  . APPENDECTOMY  2013    Family History: Family History  Problem Relation Age of Onset  . Cancer Maternal Grandmother     pancreatic  . Depression Maternal Grandfather     Social History: Social History   Social History  . Marital status: Single    Spouse name: N/A  . Number of children: N/A  . Years of education: N/A   Occupational History  . Not on file.   Social History Main Topics  . Smoking status: Former Smoker    Packs/day: 0.00  . Smokeless tobacco: Not on file  . Alcohol use No     Comment: Social   . Drug use: No  . Sexual activity: Yes    Birth control/ protection: None   Other Topics Concern  . Not on file   Social History Narrative  . No narrative on file    Medications: Prior to Admission medications   Medication Sig Start Date End Date Taking? Authorizing Provider  ondansetron (ZOFRAN-ODT) 4 MG disintegrating tablet Take 1 tablet (4 mg total) by mouth every 8 (eight) hours as needed for nausea or vomiting. Patient not taking: Reported on 02/05/2016 10/17/15   Rockne MenghiniAnne-Caroline Norman, MD  Prenatal Vit-Fe Fumarate-FA (PRENATAL MULTIVITAMIN) TABS tablet Take 1 tablet by mouth daily at  12 noon.    Historical Provider, MD  pyridOXINE (PYRIDOXINE) 25 MG tablet Take 1 tablet (25 mg total) by mouth every 8 (eight) hours as needed. Patient not taking: Reported on 02/05/2016 10/17/15   Rockne MenghiniAnne-Caroline Norman, MD    Allergies: No Known Allergies  Physical Exam: Vitals: Last menstrual period 08/31/2015.  Urine Dip Protein: N/A  FHT: 120, moderate, +accels, no decels Toco: q402min  General: Painfully contracting on arrival HEENT: normocephalic, anicteric Pulmonary: no increased work of breating Cardiovascular: RRR Abdomen: Gravid, non-tender [place]: 4.5lbs, vtx (US confirmed) Genitourinary: 2/50/-3 Extremities: no edema  Labs: No results found for this or any previous visit (from the past 24 hour(s)).  Assessment: 25 y.o. G2P1001 6524w3d by 9 week US 05/31/2016 presenting with preterm contractions  Plan: 1) Preterm contractions - 2cm on presentation.   - LR bolus and then 11825mL/hr - FFN - Nifedipine for tocolysis - Recheck cervix in 1-hr.  If change or positive FFN start BMZ and ampicillin for GBS ppx - GBS and aptima - UDS - GA >32 week magnesium suflate for CP ppx not indicated  2) Fetus - cat I tracing  3) PNL -  O pos / ABSC neg / RI / VZI / HBsAg neg / RPR NR / HIV neg / 1-hr 140 / 3-hr (70 / 85 /  63 / 36) / GBS unknown  4) TDAP - UTD 03/28/2016  5) Disposition - pending cervical recheck

## 2016-04-08 NOTE — Progress Notes (Signed)
Subjective:  Doing well, still feeling some contractions but less frequently and less intense  Objective:   Vitals: Blood pressure (!) 104/51, pulse 76, temperature (!) 96.7 F (35.9 C), temperature source Axillary, resp. rate 20, height 5\' 7"  (1.702 m), weight 64.9 kg (143 lb), last menstrual period 08/31/2015. General:  Abdomen: Cervical Exam:  Dilation: 2 Effacement (%): 70 Cervical Position: Posterior Station: -1 Presentation: Vertex Exam by:: Dayja Loveridge MD  FHT: 130, mod, +accels, no decels Toco: q6710min  Results for orders placed or performed during the hospital encounter of 04/08/16 (from the past 24 hour(s))  Type and screen [facility]     Status: None   Collection Time: 04/08/16  4:42 PM  Result Value Ref Range   ABO/RH(D) O POS    Antibody Screen NEG    Sample Expiration 04/11/2016   CBC on admission     Status: Abnormal   Collection Time: 04/08/16  4:42 PM  Result Value Ref Range   WBC 10.0 3.6 - 11.0 K/uL   RBC 3.61 (L) 3.80 - 5.20 MIL/uL   Hemoglobin 11.4 (L) 12.0 - 16.0 g/dL   HCT 16.132.1 (L) 09.635.0 - 04.547.0 %   MCV 88.9 80.0 - 100.0 fL   MCH 31.6 26.0 - 34.0 pg   MCHC 35.6 32.0 - 36.0 g/dL   RDW 40.913.1 81.111.5 - 91.414.5 %   Platelets 125 (L) 150 - 440 K/uL  Fetal fibronectin     Status: None   Collection Time: 04/08/16  4:43 PM  Result Value Ref Range   Fetal Fibronectin NEGATIVE NEGATIVE   Appearance, FETFIB CLEAR CLEAR  Urine Drug Screen, Qualitative ([facility] only)     Status: None   Collection Time: 04/08/16  4:43 PM  Result Value Ref Range   Tricyclic, Ur Screen NONE DETECTED NONE DETECTED   Amphetamines, Ur Screen NONE DETECTED NONE DETECTED   MDMA (Ecstasy)Ur Screen NONE DETECTED NONE DETECTED   Cocaine Metabolite,Ur [place] NONE DETECTED NONE DETECTED   Opiate, Ur Screen NONE DETECTED NONE DETECTED   Phencyclidine (PCP) Ur S NONE DETECTED NONE DETECTED   Cannabinoid 50 Ng, Ur [place] NONE DETECTED NONE DETECTED   Barbiturates, Ur Screen NONE DETECTED  NONE DETECTED   Benzodiazepine, Ur Scrn NONE DETECTED NONE DETECTED   Methadone Scn, Ur NONE DETECTED NONE DETECTED    Assessment:   25 y.o. G2P1001 6651w3d with PTL  Plan:   1) Labor - cervix unchanged, FFN negative, nifedipine not tolerated because of low BP's will adminsiter terbutaline.    2) Fetus - cat I tracing, we discussed pros cons of BMZ patient amenable to administration

## 2016-04-09 ENCOUNTER — Inpatient Hospital Stay
Admission: EM | Admit: 2016-04-09 | Discharge: 2016-04-09 | Disposition: A | Discharge status: Home / Self Care | Payer: Medicaid Other | Attending: Obstetrics & Gynecology | Admitting: Obstetrics & Gynecology

## 2016-04-09 DIAGNOSIS — Z3A34 34 weeks gestation of pregnancy: Secondary | ICD-10-CM | POA: Insufficient documentation

## 2016-04-09 DIAGNOSIS — Z3493 Encounter for supervision of normal pregnancy, unspecified, third trimester: Secondary | ICD-10-CM | POA: Diagnosis present

## 2016-04-09 MED ORDER — BETAMETHASONE SOD PHOS & ACET 6 (3-3) MG/ML IJ SUSP
12.0000 mg | Freq: Once | INTRAMUSCULAR | Status: AC
  Administered 2016-04-09: 12 mg via INTRAMUSCULAR

## 2016-04-11 LAB — CULTURE, BETA STREP (GROUP B ONLY)

## 2016-04-14 ENCOUNTER — Observation Stay
Admission: EM | Admit: 2016-04-14 | Discharge: 2016-04-14 | Disposition: A | Discharge status: Home / Self Care | Payer: Medicaid Other | Attending: Obstetrics and Gynecology | Admitting: Obstetrics and Gynecology

## 2016-04-14 DIAGNOSIS — O4703 False labor before 37 completed weeks of gestation, third trimester: Principal | ICD-10-CM | POA: Insufficient documentation

## 2016-04-14 DIAGNOSIS — O479 False labor, unspecified: Secondary | ICD-10-CM | POA: Diagnosis present

## 2016-04-14 DIAGNOSIS — Z3A33 33 weeks gestation of pregnancy: Secondary | ICD-10-CM | POA: Diagnosis not present

## 2016-04-14 DIAGNOSIS — W108XXA Fall (on) (from) other stairs and steps, initial encounter: Secondary | ICD-10-CM

## 2016-04-14 DIAGNOSIS — O4700 False labor before 37 completed weeks of gestation, unspecified trimester: Secondary | ICD-10-CM | POA: Diagnosis present

## 2016-04-14 NOTE — Progress Notes (Signed)
In to adjust monitor, pt reports contractions have calmed down a lot and she would like to know when the MD is returning to check her so she can go home, rates pain during contractions 4/10, "its mostly in my back" Explained plan to pt for MD to repeat exam in about 30 min and then will make decision based on findings at that time. Pt verbalized understanding.

## 2016-04-14 NOTE — Final Progress Note (Signed)
Physician Final Progress Note  Patient ID: Tracie Meyer MRN: [id number] DOB/AGE: 23/02/1991 23 y.o.  Admit date: 04/14/2016 Admitting provider: Vena AustriaAndreas Jmichael Gille, MD Discharge date: 04/14/2016   Admission Diagnoses: Preterm contractions  Discharge Diagnoses:  Active Problems:   Preterm contractions   Consults: None  Significant Findings/ Diagnostic Studies: none  Procedures: NST 120, moderate variability, +accels, no decels TOCO irritability  Discharge Condition: good  Disposition: 01-Home or Self Care  Diet: Regular diet  Discharge Activity: Activity as tolerated  Discharge Instructions    Discharge activity:  No Restrictions    Complete by:  As directed   Fetal Kick Count:  Lie on our left side for one hour after a meal, and count the number of times your baby kicks.  If it is less than 5 times, get up, move around and drink some juice.  Repeat the test 30 minutes later.  If it is still less than 5 kicks in an hour, notify your doctor.    Complete by:  As directed   No sexual activity restrictions    Complete by:  As directed   Notify physician for a general feeling that "something is not right"    Complete by:  As directed   Notify physician for increase or change in vaginal discharge    Complete by:  As directed   Notify physician for intestinal cramps, with or without diarrhea, sometimes described as "gas pain"    Complete by:  As directed   Notify physician for leaking of fluid    Complete by:  As directed   Notify physician for low, dull backache, unrelieved by heat or Tylenol    Complete by:  As directed   Notify physician for menstrual like cramps    Complete by:  As directed   Notify physician for pelvic pressure    Complete by:  As directed   Notify physician for uterine contractions.  These may be painless and feel like the uterus is tightening or the baby is  "balling up"    Complete by:  As directed   Notify physician for vaginal bleeding    Complete by:  As  directed   PRETERM LABOR:  Includes any of the follwing symptoms that occur between 20 - [redacted] weeks gestation.  If these symptoms are not stopped, preterm labor can result in preterm delivery, placing your baby at risk    Complete by:  As directed       Medication List    TAKE these medications   ondansetron 4 MG disintegrating tablet Commonly known as:  ZOFRAN-ODT Take 1 tablet (4 mg total) by mouth every 8 (eight) hours as needed for nausea or vomiting.   prenatal multivitamin Tabs tablet Take 1 tablet by mouth daily at 12 noon.   vitamin B-6 25 MG tablet Commonly known as:  pyridOXINE Take 1 tablet (25 mg total) by mouth every 8 (eight) hours as needed.        Total time spent taking care of this patient: 20 minutes  Signed: Lorrene ReidSTAEBLER, Golda Zavalza M 04/14/2016, 9:51 PM

## 2016-04-14 NOTE — OB Triage Note (Signed)
G2P1 came in with contractions starting around 1830, having contractions previously and baby has been given steroids.  Last check was 2 cm and currently still 2 cm.  Will continue to monitor.

## 2016-04-14 NOTE — Discharge Instructions (Signed)
FHT reactive, abdominal pain has improved, pt rates 3/10, "totally tolerable", VSS, EFM d'ced. Discharge instructions and teaching completed with pt and family present. Discharge paperwork including handouts related to preterm labor precautions. Encouraged pt to rest, stay well hydrated and f/u with primary OB on 04/25/16 as scheduled. Pt is aware she may return to hospital with any worsening symptoms. Pt reports active fetal movement abdominal pain has improved prior to discharge.

## 2016-04-17 ENCOUNTER — Observation Stay
Admission: EM | Admit: 2016-04-17 | Discharge: 2016-04-17 | Disposition: A | Discharge status: Home / Self Care | Payer: Medicaid Other | Attending: Obstetrics and Gynecology | Admitting: Obstetrics and Gynecology

## 2016-04-17 DIAGNOSIS — Z3A35 35 weeks gestation of pregnancy: Secondary | ICD-10-CM | POA: Diagnosis not present

## 2016-04-17 NOTE — OB Triage Note (Signed)
Patient states that she has been contracting q2-3 minutes since noon on 04/17/16. Patient is a G2P1 at 33.[redacted] weeks gestation. History of preterm delivery at 36 weeks. Rates pain 8/10.

## 2016-04-17 NOTE — Final Progress Note (Signed)
Physician Final Progress Note  Patient ID: Tracie Meyer MRN: [id number] DOB/AGE: 23/02/1991 25 y.o.  Admit date: 04/17/2016 Admitting provider: Conard NovakStephen D Adrienna Karis, MD Discharge date: 04/17/2016   Admission Diagnoses: Preterm contractions  Discharge Diagnoses:  Active Problems:   Labor and delivery, indication for care   Consults: None  Significant Findings/ Diagnostic Studies: none  Procedures: NST 130, moderate variability, +accels, no decels TOCO irritability Cervix unchanged throughout entire stay, ~2cm/50%/-2 Status post betamethasone x 2 on 8/29-8/30  Discharge Condition: good  Disposition: 01-Home or Self Care  Diet: Regular diet  Discharge Activity: Activity as tolerated     Medication List    STOP taking these medications   ondansetron 4 MG disintegrating tablet Commonly known as:  ZOFRAN-ODT   vitamin B-6 25 MG tablet Commonly known as:  pyridOXINE     TAKE these medications   prenatal multivitamin Tabs tablet Take 1 tablet by mouth daily at 12 noon.        Total time spent taking care of this patient: 20 minutes  Signed: Conard NovakJackson, Kalla Watson D 04/17/2016, 6:04 PM

## 2016-05-06 IMAGING — US US OB TRANSVAGINAL
1 series · 14 of 28 positions shown · non-contrast
Comparison: Ultrasound 12/30/2011.

CLINICAL DATA: Abdominal pain.  Pregnancy.

EXAM:
OBSTETRIC <14 WK US AND TRANSVAGINAL OB US
TECHNIQUE: Both transabdominal and transvaginal ultrasound examinations were
performed for complete evaluation of the gestation as well as the
maternal uterus, adnexal regions, and pelvic cul-de-sac.
Transvaginal technique was performed to assess early pregnancy.

[Series 1: us ob transvaginal · 0.22mm/px · 14 of 102 slices shown]
[im 4/102]
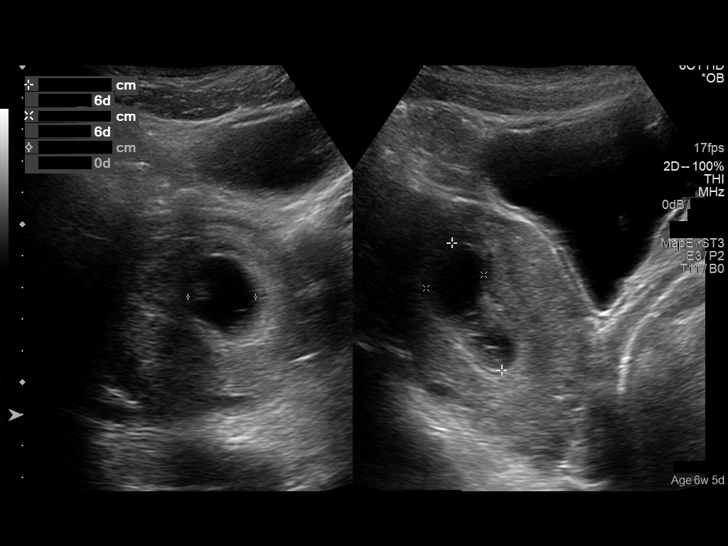
[im 12/102]
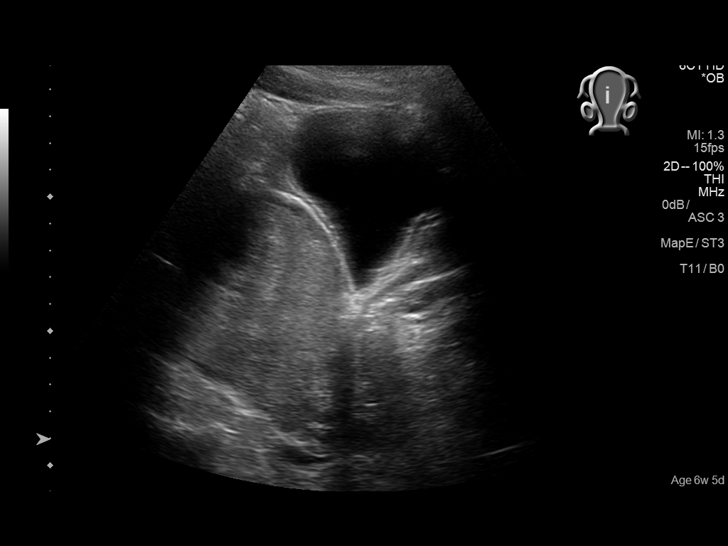
[im 19/102]
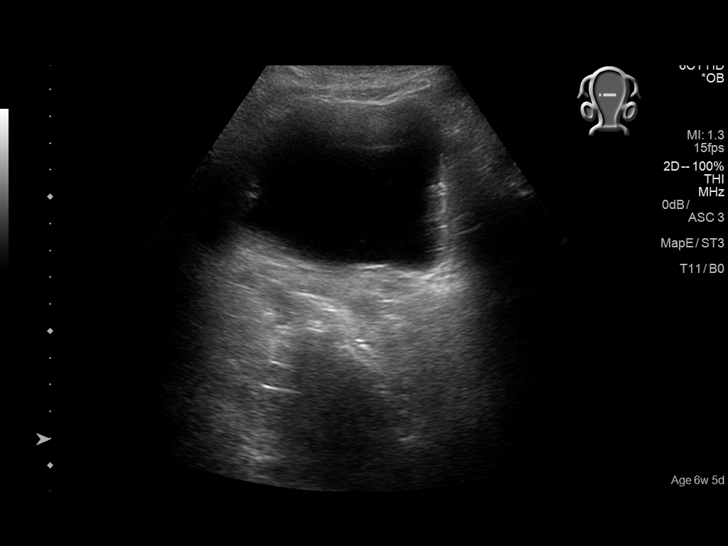
[im 27/102]
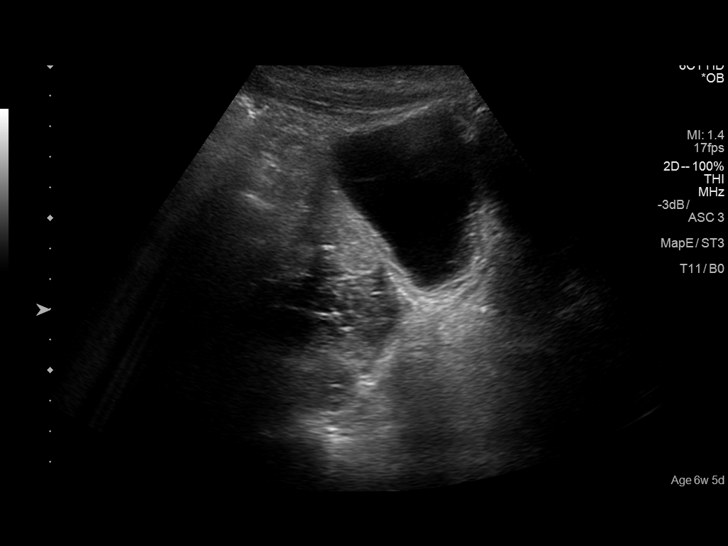
[im 34/102]
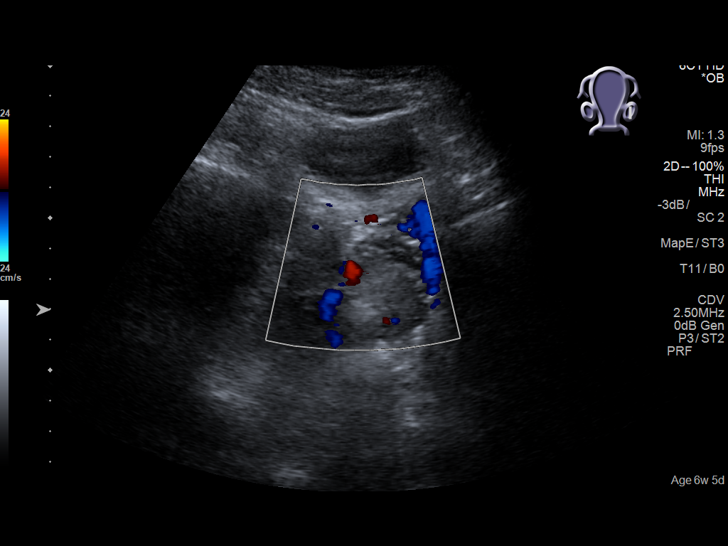
[im 42/102]
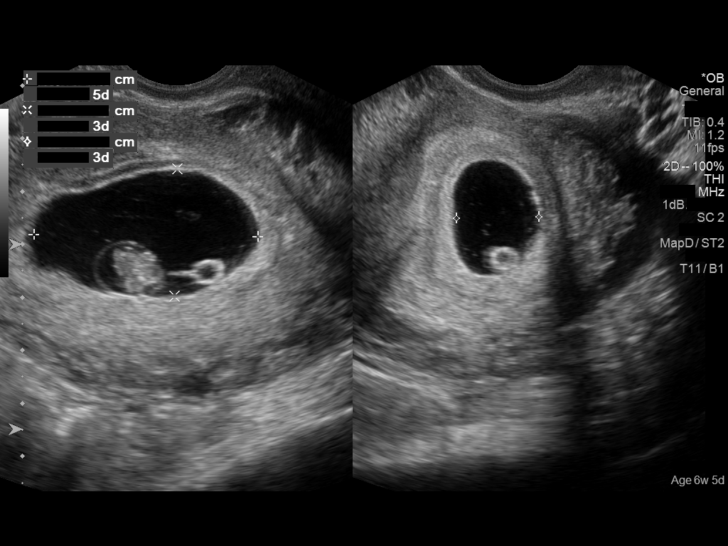
[im 49/102]
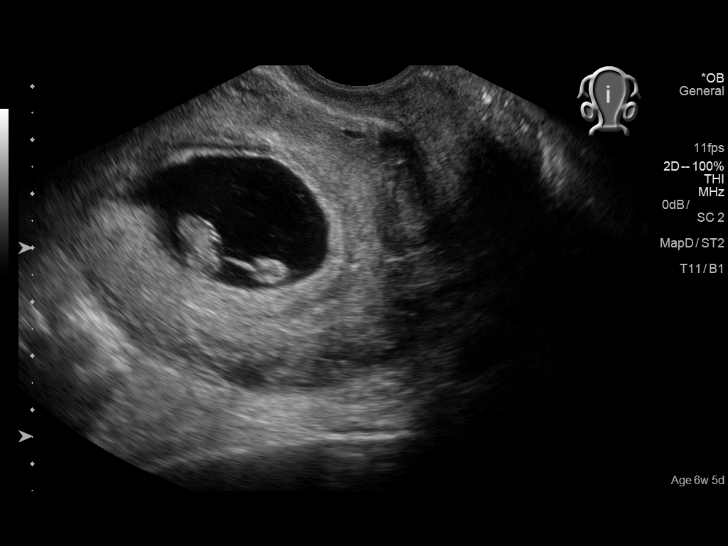
[im 57/102]
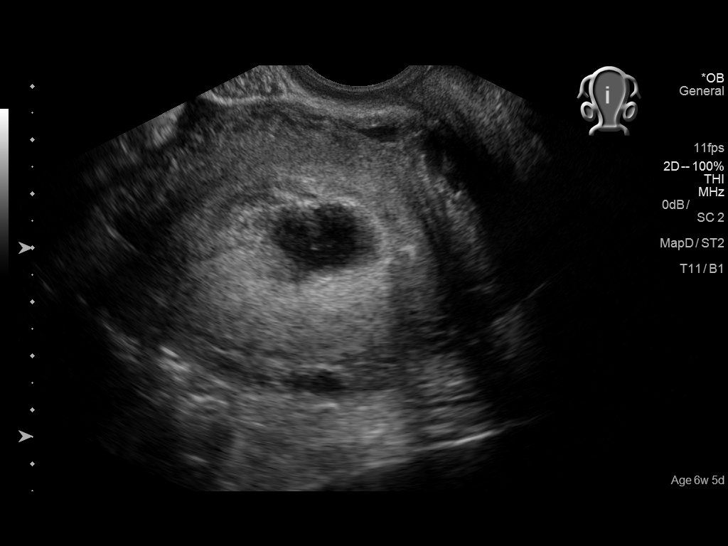
[im 64/102]
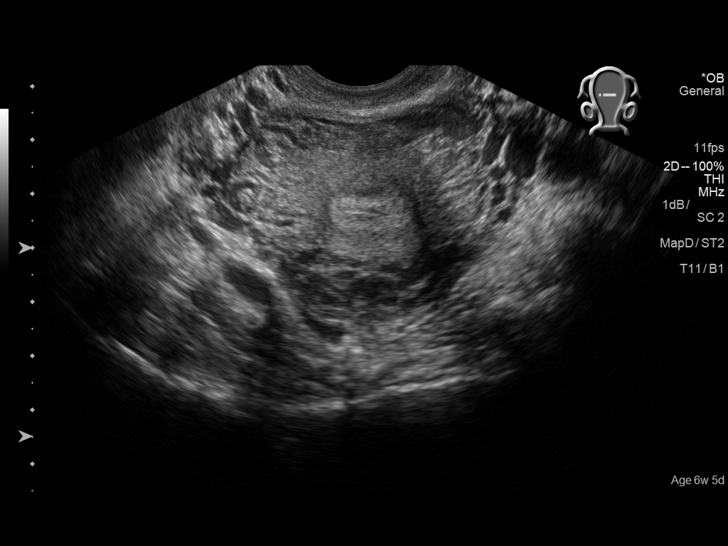
[im 72/102]
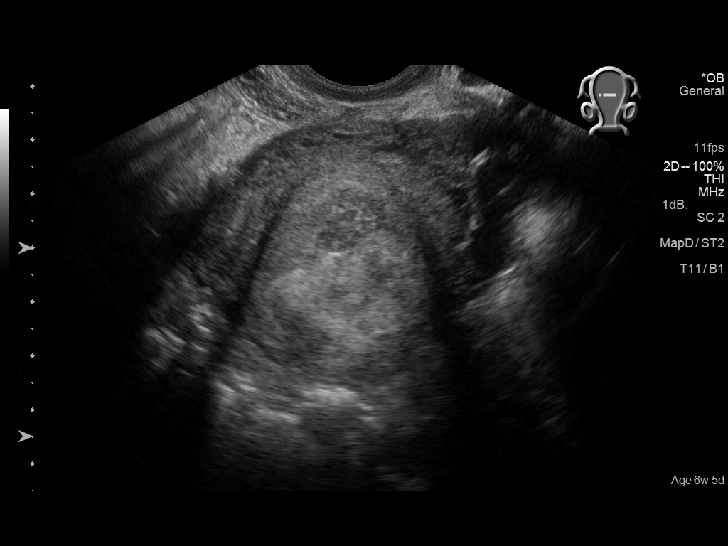
[im 79/102]
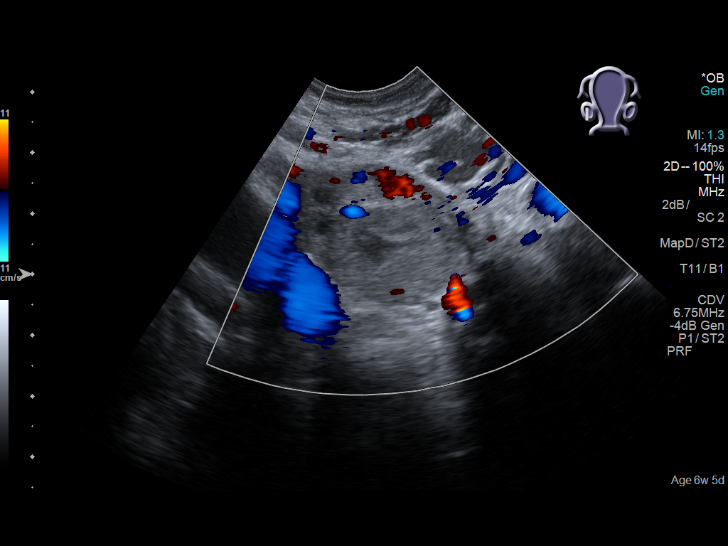
[im 87/102]
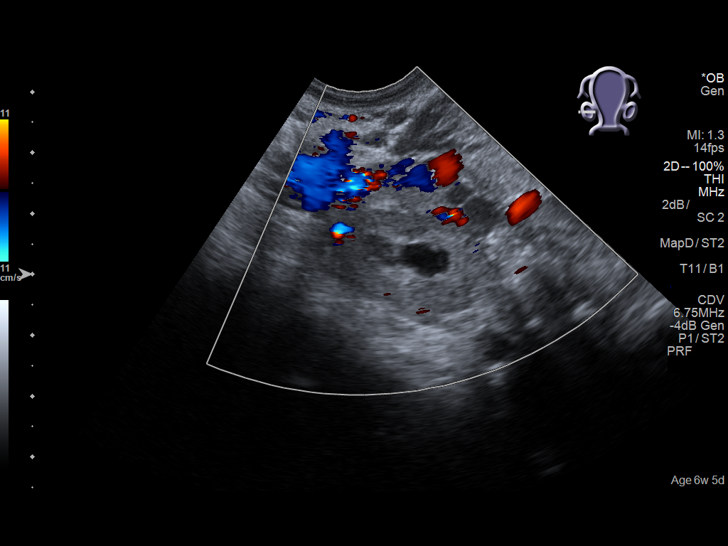
[im 94/102]
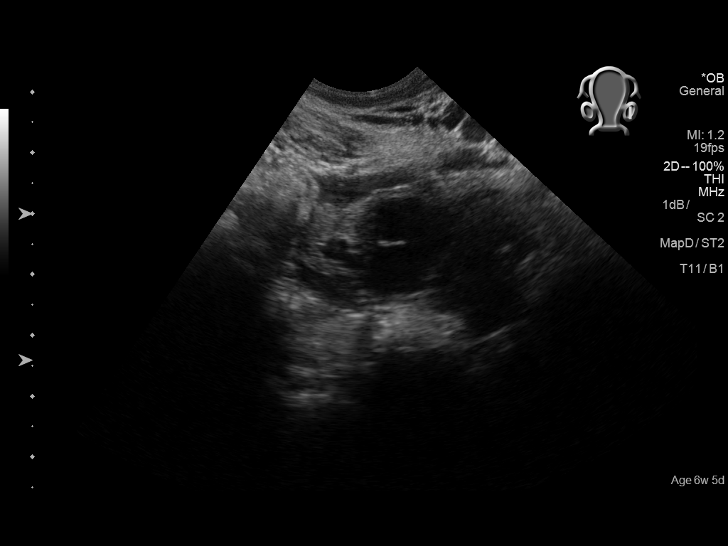
[im 102/102]
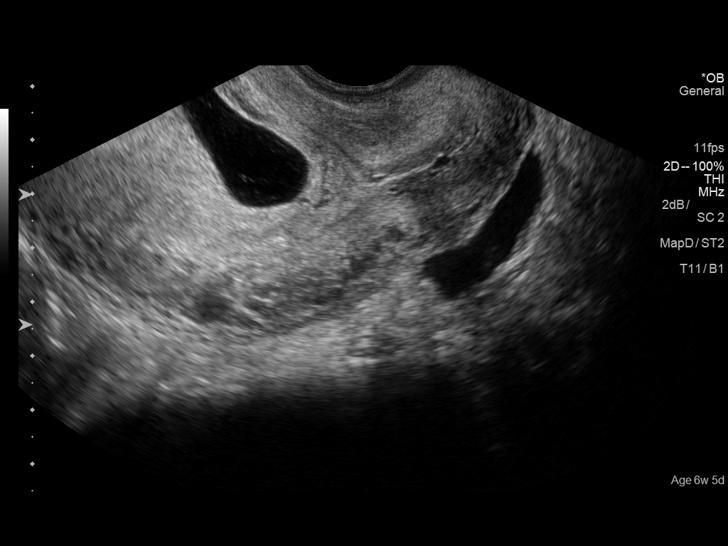

[14 of 28 positions shown; findings below may reference images not displayed]

FINDINGS: Intrauterine gestational sac: Visualized/normal in shape.

Yolk sac:  Present.

Embryo:  Present.

Cardiac Activity: Present.

Heart Rate: 145  bpm

CRL:  1.5 cm  mm   7 w   6 d                  US EDC: 05/19/2016.

Subchorionic hemorrhage:  None visualized.

Maternal uterus/adnexae: 1.9 cm right ovarian corpus luteal cyst.
Small amount of free pelvic fluid.
IMPRESSION: Single viable intrauterine pregnancy at 7 weeks 6 days. Small right
ovarian corpus luteal cyst. Small amount of free pelvic fluid.

## 2016-05-14 ENCOUNTER — Observation Stay
Admission: EM | Admit: 2016-05-14 | Discharge: 2016-05-14 | Disposition: A | Discharge status: Home / Self Care | Payer: Medicaid Other | Attending: Obstetrics and Gynecology | Admitting: Obstetrics and Gynecology

## 2016-05-14 DIAGNOSIS — O479 False labor, unspecified: Secondary | ICD-10-CM | POA: Diagnosis present

## 2016-05-14 DIAGNOSIS — O471 False labor at or after 37 completed weeks of gestation: Principal | ICD-10-CM | POA: Insufficient documentation

## 2016-05-14 DIAGNOSIS — Z3A37 37 weeks gestation of pregnancy: Secondary | ICD-10-CM | POA: Diagnosis not present

## 2016-05-14 NOTE — Discharge Summary (Signed)
Patient discharged with instructions on follow up appointments, labor precautions, and when to seek medical attention. Patient ambulatory at discharge with steady gait. Patient denies complaints at this time. Patient discharged with significant other.

## 2016-05-14 NOTE — Final Progress Note (Addendum)
Physician Final Progress Note  Patient ID: Tracie Meyer MRN: [id number] DOB/AGE: 23/02/1991 25 y.o.  Admit date: 05/14/2016 Admitting provider: Conard NovakStephen D Ensley Blas, MD Discharge date: 05/14/2016   Admission Diagnoses: contractions without labor  Discharge Diagnoses:  False labor at 7624w4d gestation  Consults: None  Significant Findings/ Diagnostic Studies: none  Procedures: NST 130, moderate variability, +accels, no decels TOCO 2 ctx q 10 minutes, spacing out to much less frequent Cervix unchanged throughout entire stay, ~2cm/50%/-2 Status post betamethasone x 2 on 8/29-8/30  Discharge Condition: good  Disposition: 01-Home or Self Care  Diet: Regular diet  Discharge Activity: Activity as tolerated  Total time spent taking care of this patient: 20 minutes  Signed: Conard NovakJackson, Ori Trejos D, MD 05/14/2016, 6:55 PM

## 2016-05-29 ENCOUNTER — Inpatient Hospital Stay: Payer: Medicaid Other | Admitting: Anesthesiology

## 2016-05-29 ENCOUNTER — Inpatient Hospital Stay
Admission: EM | Admit: 2016-05-29 | Discharge: 2016-05-31 | DRG: 775 | Disposition: A | Discharge status: Home / Self Care | Payer: Medicaid Other | Attending: Obstetrics and Gynecology | Admitting: Obstetrics and Gynecology

## 2016-05-29 DIAGNOSIS — Z3483 Encounter for supervision of other normal pregnancy, third trimester: Secondary | ICD-10-CM | POA: Diagnosis present

## 2016-05-29 DIAGNOSIS — Z87891 Personal history of nicotine dependence: Secondary | ICD-10-CM

## 2016-05-29 DIAGNOSIS — Z3A39 39 weeks gestation of pregnancy: Secondary | ICD-10-CM

## 2016-05-29 LAB — TYPE AND SCREEN
ABO/RH(D): O POS
ANTIBODY SCREEN: NEGATIVE

## 2016-05-29 LAB — CBC
HCT: 32.9 % — ABNORMAL LOW (ref 35.0–47.0)
HEMOGLOBIN: 11.3 g/dL — AB (ref 12.0–16.0)
MCH: 30.3 pg (ref 26.0–34.0)
MCHC: 34.4 g/dL (ref 32.0–36.0)
MCV: 88.1 fL (ref 80.0–100.0)
PLATELETS: 103 10*3/uL — AB (ref 150–440)
RBC: 3.73 MIL/uL — AB (ref 3.80–5.20)
RDW: 13.7 % (ref 11.5–14.5)
WBC: 10.7 10*3/uL (ref 3.6–11.0)

## 2016-05-29 MED ORDER — MEPERIDINE HCL 25 MG/ML IJ SOLN
6.2500 mg | INTRAMUSCULAR | Status: DC | PRN
Start: 2016-05-29 — End: 2016-05-30

## 2016-05-29 MED ORDER — LACTATED RINGERS IV SOLN
INTRAVENOUS | Status: DC
  Administered 2016-05-29: 21:00:00 via INTRAVENOUS

## 2016-05-29 MED ORDER — NALBUPHINE HCL 10 MG/ML IJ SOLN: 5.0000 mg | Freq: Once | INTRAMUSCULAR | Status: DC | PRN

## 2016-05-29 MED ORDER — SODIUM CHLORIDE 0.9% FLUSH: 3.0000 mL | INTRAVENOUS | Status: DC | PRN

## 2016-05-29 MED ORDER — LACTATED RINGERS IV SOLN: 500.0000 mL | INTRAVENOUS | Status: DC | PRN

## 2016-05-29 MED ORDER — DIPHENHYDRAMINE HCL 25 MG PO CAPS: 25.0000 mg | ORAL_CAPSULE | ORAL | Status: DC | PRN

## 2016-05-29 MED ORDER — DIPHENHYDRAMINE HCL 50 MG/ML IJ SOLN: 12.5000 mg | INTRAMUSCULAR | Status: DC | PRN

## 2016-05-29 MED ORDER — OXYTOCIN BOLUS FROM INFUSION: 500.0000 mL | Freq: Once | INTRAVENOUS | Status: DC

## 2016-05-29 MED ORDER — OXYTOCIN 10 UNIT/ML IJ SOLN
INTRAMUSCULAR | Status: AC
  Filled 2016-05-29: qty 2

## 2016-05-29 MED ORDER — NALOXONE HCL 2 MG/2ML IJ SOSY
1.0000 ug/kg/h | PREFILLED_SYRINGE | INTRAMUSCULAR | Status: DC | PRN
  Filled 2016-05-29: qty 2

## 2016-05-29 MED ORDER — SOD CITRATE-CITRIC ACID 500-334 MG/5ML PO SOLN
30.0000 mL | ORAL | Status: DC | PRN
  Filled 2016-05-29: qty 30

## 2016-05-29 MED ORDER — NALBUPHINE HCL 10 MG/ML IJ SOLN: 5.0000 mg | INTRAMUSCULAR | Status: DC | PRN

## 2016-05-29 MED ORDER — OXYTOCIN 40 UNITS IN LACTATED RINGERS INFUSION - SIMPLE MED
INTRAVENOUS | Status: AC
  Filled 2016-05-29: qty 1000

## 2016-05-29 MED ORDER — NALOXONE HCL 0.4 MG/ML IJ SOLN: 0.4000 mg | INTRAMUSCULAR | Status: DC | PRN

## 2016-05-29 MED ORDER — LACTATED RINGERS IV BOLUS (SEPSIS): 1000.0000 mL | INTRAVENOUS | Status: DC

## 2016-05-29 MED ORDER — MISOPROSTOL 200 MCG PO TABS
ORAL_TABLET | ORAL | Status: AC
  Filled 2016-05-29: qty 4

## 2016-05-29 MED ORDER — FENTANYL 2.5 MCG/ML W/ROPIVACAINE 0.2% IN NS 100 ML EPIDURAL INFUSION (ARMC-ANES)
EPIDURAL | Status: AC
  Filled 2016-05-29: qty 100

## 2016-05-29 MED ORDER — FENTANYL 2.5 MCG/ML W/ROPIVACAINE 0.2% IN NS 100 ML EPIDURAL INFUSION (ARMC-ANES)
EPIDURAL | Status: DC | PRN
  Administered 2016-05-29: 10 mL/h via EPIDURAL

## 2016-05-29 MED ORDER — FENTANYL 2.5 MCG/ML W/ROPIVACAINE 0.2% IN NS 100 ML EPIDURAL INFUSION (ARMC-ANES): 10.0000 mL/h | EPIDURAL | Status: DC

## 2016-05-29 MED ORDER — OXYTOCIN 40 UNITS IN LACTATED RINGERS INFUSION - SIMPLE MED: 2.5000 [IU]/h | INTRAVENOUS | Status: DC

## 2016-05-29 MED ORDER — AMMONIA AROMATIC IN INHA
RESPIRATORY_TRACT | Status: AC
  Filled 2016-05-29: qty 10

## 2016-05-29 MED ORDER — LIDOCAINE HCL (PF) 1 % IJ SOLN
30.0000 mL | INTRAMUSCULAR | Status: AC | PRN
  Administered 2016-05-29: 1 mL via SUBCUTANEOUS

## 2016-05-29 MED ORDER — ONDANSETRON HCL 4 MG/2ML IJ SOLN: 4.0000 mg | Freq: Three times a day (TID) | INTRAMUSCULAR | Status: DC | PRN

## 2016-05-29 MED ORDER — SCOPOLAMINE 1 MG/3DAYS TD PT72: 1.0000 | MEDICATED_PATCH | Freq: Once | TRANSDERMAL | Status: DC

## 2016-05-29 MED ORDER — OXYTOCIN 40 UNITS IN LACTATED RINGERS INFUSION - SIMPLE MED
1.0000 m[IU]/min | INTRAVENOUS | Status: DC
  Administered 2016-05-29: 1 m[IU]/min via INTRAVENOUS

## 2016-05-29 MED ORDER — LIDOCAINE HCL (PF) 1 % IJ SOLN
INTRAMUSCULAR | Status: AC
  Filled 2016-05-29: qty 30

## 2016-05-29 MED ORDER — OXYTOCIN 10 UNIT/ML IJ SOLN: 10.0000 [IU] | Freq: Once | INTRAMUSCULAR | Status: DC

## 2016-05-29 MED ORDER — IBUPROFEN 600 MG PO TABS: 600.0000 mg | ORAL_TABLET | Freq: Four times a day (QID) | ORAL | Status: DC | PRN

## 2016-05-29 MED ORDER — TERBUTALINE SULFATE 1 MG/ML IJ SOLN: 0.2500 mg | Freq: Once | INTRAMUSCULAR | Status: DC | PRN

## 2016-05-29 MED ORDER — ONDANSETRON HCL 4 MG/2ML IJ SOLN: 4.0000 mg | Freq: Four times a day (QID) | INTRAMUSCULAR | Status: DC | PRN

## 2016-05-29 NOTE — H&P (Addendum)
Obstetric H&P   Chief Complaint: Advanced cervical dilation, sent over from office  Prenatal Care Provider: [facility]  History of Present Illness: 25 y.o. G2P1001 4955w5d by 9 week US derived EDC of 05/31/2016, presenting to L&D with contractions.  No LOF, no VB, +FM.  Prenatal care this far complicated by PTL at 32 weeks for which she was given betamethasone. She also had elevated 1-hr OGTT at 28 weeks with normal follow up 3-hr.  33 lbs weight gain thus far this pregnancy  O pos / ABSC neg / RI / VZI / HBsAg neg / RPR NR / HIV neg / 1-hr 140 / 3-hr (70 / 85 / 63 / 36) / GBS negative (05/09/16)  Pelvis tested to 6lbs 12oz at 37 weeks.  TDAP received 03/28/2016  Review of Systems: 10 point review of systems negative unless otherwise noted in HPI  Past Medical History: No medical conditions  Past Surgical History: Past Surgical History:  Procedure Laterality Date  . APPENDECTOMY  2013   Family History  Problem Relation Age of Onset  . Cancer Maternal Grandmother     pancreatic  . Depression Maternal Grandfather    Social History   Social History  . Marital status: Single    Spouse name: N/A  . Number of children: N/A  . Years of education: N/A   Occupational History  . Not on file.   Social History Main Topics  . Smoking status: Former Smoker    Packs/day: 0.00  . Smokeless tobacco: Never Used  . Alcohol use No     Comment: Social   . Drug use: No  . Sexual activity: Yes    Birth control/ protection: None   Other Topics Concern  . Not on file   Social History Narrative  . No narrative on file    Medications: Prior to Admission medications   Medication Sig Start Date End Date Taking? Authorizing Provider  Prenatal Vit-Fe Fumarate-FA (PRENATAL MULTIVITAMIN) TABS tablet Take 1 tablet by mouth daily at 12 noon.    Historical Provider, MD    Allergies: No Known Allergies  Physical Exam: Height: 5'7",  Weight: 152 lbs, BP (clinic today) 120/80 (no vitals recorded  so far this visit)  FHT: 120, moderate, +accels, no decels Toco: 3 q 10 min General: NAD HEENT: normocephalic, anicteric Pulmonary: no increased work of breating Cardiovascular: RRR Abdomen: Gravid, non-tender Genitourinary: 5/70/-1 Extremities: no edema  Assessment: 25 y.o. G2P1001 3655w5d by 9 week US 05/31/2016 presenting with advanced cervical dilation.  Plan: 1) Advanced cervical dilation: Plan to AROM vs pitocin.  2) Fetus - cat I tracing  3) PNL -  O pos / ABSC neg / RI / VZI / HBsAg neg / RPR NR / HIV neg / 1-hr 140 / 3-hr (70 / 85 / 63 / 36) / GBS negative (05/09/16)  4) TDAP - UTD 03/28/2016  5) Disposition - discharge post delivery.  Thomasene MohairStephen Danzig Macgregor, MD 05/29/2016 6:48 PM

## 2016-05-29 NOTE — Progress Notes (Signed)
Labor Check  Subj:  Complaints: feeling more pressure   Obj:  Temp 97.9 F (36.6 C) (Oral)   Resp 20   Wt 153 lb (69.4 kg)   LMP 08/31/2015   BMI 23.96 kg/m  Dose (milli-units/min) Oxytocin: 3 milli-units/min  Cervix: Dilation: Lip/rim / Effacement (%): 100 / Station: -1  Baseline FHR: 105    Variability: moderate    Accelerations: absent (10x10 present occasionally)    Decelerations: present (early decels) Contractions: present frequency: 4-5 q 10 min  A/P: 25 y.o. G2P1001 female at 6727w5d with labor, fetal bradycardia.  1.  Labor: continue pitocin. Now appears adequate and is making reasonable change in her cervical dilation.   2.  FWB: reassuring overall, Overall assessment: category 2.    ** discussed continuing to expedite delivery. Station is a little high.  But, baseline FHR is improved to about 105.  Will continue with current plan of management.  3.  GBS neg  4.  Pain: epidural 5.  Recheck: prn   Thomasene MohairStephen Chicquita Mendel, MD 05/29/2016 10:36 PM

## 2016-05-29 NOTE — Anesthesia Preprocedure Evaluation (Signed)
Anesthesia Evaluation  Patient identified by MRN, date of birth, ID band Patient awake    Reviewed: Allergy & Precautions, NPO status , Patient's Chart, lab work & pertinent test results  History of Anesthesia Complications Negative for: history of anesthetic complications  Airway Mallampati: II       Dental   Pulmonary neg pulmonary ROS, former smoker,           Cardiovascular negative cardio ROS       Neuro/Psych negative neurological ROS     GI/Hepatic negative GI ROS, Neg liver ROS,   Endo/Other  negative endocrine ROS  Renal/GU negative Renal ROS     Musculoskeletal   Abdominal   Peds  Hematology negative hematology ROS (+)   Anesthesia Other Findings   Reproductive/Obstetrics                             Anesthesia Physical Anesthesia Plan  ASA: II  Anesthesia Plan: Epidural   Post-op Pain Management:    Induction:   Airway Management Planned:   Additional Equipment:   Intra-op Plan:   Post-operative Plan:   Informed Consent: I have reviewed the patients History and Physical, chart, labs and discussed the procedure including the risks, benefits and alternatives for the proposed anesthesia with the patient or authorized representative who has indicated his/her understanding and acceptance.     Plan Discussed with:   Anesthesia Plan Comments:         Anesthesia Quick Evaluation  

## 2016-05-29 NOTE — Progress Notes (Signed)
Discussed plan of management.  With all interventions, fetal heart rate is now about 100-105 with early decelerations. "pseudosinusoidal" waveform.  Variability is still moderate. No accelerations in last 20 minutes.  Plan to move to cesarean delivery if no improvement in 30-45 minutes. All questions answered.  Thomasene MohairStephen Jackson, MD 05/29/2016 9:42 PM

## 2016-05-29 NOTE — Progress Notes (Signed)
Labor Check  Subj:  Complaints: comfortable with epidural   Obj:  Wt 153 lb (69.4 kg)   LMP 08/31/2015   BMI 23.96 kg/m     Cervix: Dilation: 7 / Effacement (%): 90 / Station: -2  Baseline FHR: 95!    Variability: moderate    Accelerations: PRESENT    Decelerations: absent Contractions: present frequency: 3 q 10 minutes AROM - clear fluid   A/P: 25 y.o. G2P1001 female at 7077w5d with active labor, fetal bradycaria.  1.  Labor: AROM to augment.    2.  FWB: guarded, but overall reassuring, Overall assessment: category 2 ** fetus with very slow decline in baseline fetal heart rate (120 bpm at admission).  More recently 90-95.  The other indicators are strong (moderatve variability, accelerations, no decelerations).  I have attempted intrauterine resuscitation with position changes.  Will give IV fluid bolus.  I am attempted to expedite delivery with AROM and will start pitocin.  If any other concerning indicators occur (minimal variability, no accelerations, +decels).  The fetus did have a positive response to scalp stimulation with a heart rate going from 95 - 115 with active stimulation of the fetal scalp. Watch very closely with low threshold to deliver by cesarean emergently.   3.  GBS negative  4.  Pain: epidural working well 5.  Recheck: 2 hours.   Thomasene MohairStephen Dwyne Hasegawa, MD 05/29/2016 8:44 PM

## 2016-05-29 NOTE — Anesthesia Procedure Notes (Signed)
Epidural Patient location during procedure: OB Start time: 05/29/2016 6:30 PM End time: 05/29/2016 6:47 PM  Preanesthetic Checklist Completed: patient identified, site marked, surgical consent, pre-op evaluation, timeout performed, IV checked, risks and benefits discussed and monitors and equipment checked  Epidural Patient position: sitting Prep: ChloraPrep Patient monitoring: heart rate, continuous pulse ox and blood pressure Approach: midline Location: L4-L5 Injection technique: LOR saline  Needle:  Needle type: Tuohy  Needle gauge: 17 G Needle length: 9 cm and 9 Catheter type: closed end flexible Catheter size: 19 Gauge Catheter at skin depth: 8 cm Test dose: negative and 1.5% lidocaine with Epi 1:200 K  Assessment Events: blood not aspirated, injection not painful, no injection resistance, negative IV test and no paresthesia  Additional Notes   Patient tolerated the insertion well without complications.Reason for block:procedure for pain

## 2016-05-29 NOTE — Progress Notes (Signed)
Patient ID: Tracie Meyer, female   DOB: 01/11/1991, 25 y.o.   MRN: [id number]  O2 applied, IVF bolus going. IUPC and FSE applied without difficulty. Pitocin started.  Discussed heart rate issue with patient.  Discussed quickly moving to cesarean if any other issue arises. Expediting delivery with pitocin/AROM.  Thomasene MohairStephen Tiajuana Leppanen, MD 05/29/2016 9:17 PM

## 2016-05-30 LAB — CBC
HCT: 29.7 % — ABNORMAL LOW (ref 35.0–47.0)
Hemoglobin: 10 g/dL — ABNORMAL LOW (ref 12.0–16.0)
MCH: 29.8 pg (ref 26.0–34.0)
MCHC: 33.7 g/dL (ref 32.0–36.0)
MCV: 88.5 fL (ref 80.0–100.0)
Platelets: 84 10*3/uL — ABNORMAL LOW (ref 150–440)
RBC: 3.36 MIL/uL — ABNORMAL LOW (ref 3.80–5.20)
RDW: 13.4 % (ref 11.5–14.5)
WBC: 13.1 10*3/uL — ABNORMAL HIGH (ref 3.6–11.0)

## 2016-05-30 MED ORDER — WITCH HAZEL-GLYCERIN EX PADS
1.0000 "application " | MEDICATED_PAD | CUTANEOUS | Status: DC | PRN
  Filled 2016-05-30: qty 100

## 2016-05-30 MED ORDER — COCONUT OIL OIL
1.0000 "application " | TOPICAL_OIL | Status: DC | PRN
  Filled 2016-05-30: qty 120

## 2016-05-30 MED ORDER — ONDANSETRON HCL 4 MG PO TABS: 4.0000 mg | ORAL_TABLET | ORAL | Status: DC | PRN

## 2016-05-30 MED ORDER — HYDROCODONE-ACETAMINOPHEN 5-325 MG PO TABS
1.0000 | ORAL_TABLET | ORAL | Status: DC | PRN
  Administered 2016-05-30 – 2016-05-31 (×4): 1 via ORAL
  Filled 2016-05-30 (×4): qty 1

## 2016-05-30 MED ORDER — BENZOCAINE-MENTHOL 20-0.5 % EX AERO
1.0000 "application " | INHALATION_SPRAY | CUTANEOUS | Status: DC | PRN
  Filled 2016-05-30: qty 56

## 2016-05-30 MED ORDER — SIMETHICONE 80 MG PO CHEW: 80.0000 mg | CHEWABLE_TABLET | ORAL | Status: DC | PRN

## 2016-05-30 MED ORDER — DIBUCAINE 1 % RE OINT
1.0000 "application " | TOPICAL_OINTMENT | RECTAL | Status: DC | PRN
  Filled 2016-05-30: qty 28

## 2016-05-30 MED ORDER — FERROUS SULFATE 325 (65 FE) MG PO TABS
325.0000 mg | ORAL_TABLET | Freq: Two times a day (BID) | ORAL | Status: DC
  Administered 2016-05-30 – 2016-05-31 (×3): 325 mg via ORAL
  Filled 2016-05-30 (×3): qty 1

## 2016-05-30 MED ORDER — ACETAMINOPHEN 325 MG PO TABS
ORAL_TABLET | ORAL | Status: AC
  Administered 2016-05-30: 650 mg via ORAL
  Filled 2016-05-30: qty 2

## 2016-05-30 MED ORDER — IBUPROFEN 600 MG PO TABS
600.0000 mg | ORAL_TABLET | Freq: Four times a day (QID) | ORAL | Status: DC
  Administered 2016-05-30 – 2016-05-31 (×5): 600 mg via ORAL
  Filled 2016-05-30 (×5): qty 1

## 2016-05-30 MED ORDER — SENNOSIDES-DOCUSATE SODIUM 8.6-50 MG PO TABS
2.0000 | ORAL_TABLET | ORAL | Status: DC
  Administered 2016-05-30: 2 via ORAL
  Filled 2016-05-30: qty 2

## 2016-05-30 MED ORDER — DIPHENHYDRAMINE HCL 25 MG PO CAPS: 25.0000 mg | ORAL_CAPSULE | Freq: Four times a day (QID) | ORAL | Status: DC | PRN

## 2016-05-30 MED ORDER — PRENATAL MULTIVITAMIN CH
1.0000 | ORAL_TABLET | Freq: Every day | ORAL | Status: DC
  Administered 2016-05-30: 1 via ORAL
  Filled 2016-05-30: qty 1

## 2016-05-30 MED ORDER — ONDANSETRON HCL 4 MG/2ML IJ SOLN
4.0000 mg | INTRAMUSCULAR | Status: DC | PRN
  Administered 2016-05-30: 4 mg via INTRAVENOUS
  Filled 2016-05-30: qty 2

## 2016-05-30 MED ORDER — ACETAMINOPHEN 325 MG PO TABS
650.0000 mg | ORAL_TABLET | ORAL | Status: DC | PRN
  Administered 2016-05-30 (×2): 650 mg via ORAL
  Filled 2016-05-30: qty 2

## 2016-05-30 NOTE — Progress Notes (Signed)
Post Partum Day 1 Subjective: Doing well, no complaints.  Tolerating regular diet, pain with PO meds, voiding and ambulating without difficulty.  No CP SOB F/C N/V or leg pain No HA, change of vision, RUQ/epigastric pain  Objective: BP 113/64   Pulse 64   Temp 98 F (36.7 C) (Oral)   Resp 18   Wt 153 lb (69.4 kg)   LMP 08/31/2015   SpO2 100%   Breastfeeding   BMI 23.96 kg/m   Physical Exam:  General: NAD CV: RRR Pulm: nl effort, CTABL Lochia: moderate Uterine Fundus: fundus firm and below umbilicus DVT Evaluation: no cords, ttp LEs    Recent Labs  05/29/16 1741 05/30/16 0525  HGB 11.3* 10.0*  HCT 32.9* 29.7*  WBC 10.7 13.1*  PLT 103* 84*    Assessment/Plan: 25 y.o. G2P2002 postpartum day # 1  1. Continue routine postpartum care 2. O+, Rubella Immune, Varicella Immune 3. TDAP UTD 4. Breast/Contraception: Depo Provera 5. Disposition: Discharge to home PPD2   Tresea MallGLEDHILL,Envy Meno, CNM

## 2016-05-30 NOTE — Anesthesia Postprocedure Evaluation (Signed)
Anesthesia Post Note  Patient: Holland Fallingaron L Zegarra  Procedure(s) Performed: * No procedures listed *  Patient location during evaluation: Mother Baby Anesthesia Type: Epidural Level of consciousness: awake, awake and alert and oriented Pain management: pain level controlled Vital Signs Assessment: post-procedure vital signs reviewed and stable Respiratory status: nonlabored ventilation, spontaneous breathing and respiratory function stable Cardiovascular status: blood pressure returned to baseline and stable Postop Assessment: no headache and no backache Anesthetic complications: no    Last Vitals:  Vitals:   05/30/16 0226 05/30/16 0304  BP: 105/81 126/70  Pulse: 80 70  Resp: 18 20  Temp:  36.9 C    Last Pain:  Vitals:   05/30/16 0658  TempSrc:   PainSc: 6                  Masco CorporationStephanie Shawntel Farnworth

## 2016-05-30 NOTE — Discharge Summary (Signed)
OB Discharge Summary     Patient Name: Tracie Meyer DOB: 03/29/1991 MRN: [id number]  Date of admission: 05/29/2016 Delivering MD: Conard NovakJackson, Stephen D, MD  Date of Delivery: 05/30/2016  Date of discharge: 05/31/16  Admitting diagnosis: contractions 39 wks, active labor   Intrauterine pregnancy: 3644w6d      Secondary diagnosis: None     Discharge diagnosis: Term Pregnancy Delivered                                                                                                Post partum procedures:none  Augmentation: AROM and Pitocin  Complications: None  Hospital course:  Onset of Labor With Vaginal Delivery     25 y.o. yo G2P1001 at 2144w6d was admitted in Active Labor on 05/29/2016. Patient had a labor course as follows: She presented in active labor and quickly dilated to 7cm (admitted at 5cm).  Fetus with bradycardia to 90s for a prolonged period (see notes).  AROM and active management of labor with pitocin.   Membrane Rupture Time/Date: 8:42 PM ,05/29/2016   Intrapartum Procedures: Episiotomy: None [1]                                         Lacerations:  None [1]  Patient had a delivery of a Viable infant. 05/29/2016  Information for the patient's newborn:  Tracie Meyer, Tracie Meyer [[id number]][[id number]]      Pateint had an uncomplicated postpartum course.  She is ambulating, tolerating a regular diet, passing flatus, and urinating well. Patient is discharged home in stable condition on 05/31/16.    Physical exam  Vitals:   05/30/16 1246 05/30/16 1622 05/30/16 1950 05/31/16 0813  BP: 119/66 107/70 134/80 116/66  Pulse: 82 62 68 75  Resp: 18 18 20 18   Temp: 97.6 F (36.4 C) 98 F (36.7 C) 98.6 F (37 C) 97.9 F (36.6 C)  TempSrc: Oral Oral Oral Oral  SpO2: 100%  100% 99%  Weight:       General: alert, cooperative and no distress Lochia: appropriate Uterine Fundus: firm Incision: N/A DVT Evaluation: No evidence of DVT seen on physical exam. No cords or calf tenderness. No  significant calf/ankle edema.  Labs: Lab Results  Component Value Date   WBC 13.1 (H) 05/30/2016   HGB 10.0 (L) 05/30/2016   HCT 29.7 (L) 05/30/2016   MCV 88.5 05/30/2016   PLT 84 (L) 05/30/2016   CMP Latest Ref Rng & Units 08/16/2015  Glucose 65 - 99 mg/dL 77  BUN 6 - 20 mg/dL 13  Creatinine 7.820.57 - 9.561.00 mg/dL 2.130.61  Sodium 086134 - 578144 mmol/L 142  Potassium 3.5 - 5.2 mmol/L 4.1  Chloride 96 - 106 mmol/L 102  CO2 18 - 29 mmol/L 22  Calcium 8.7 - 10.2 mg/dL 9.3  Total Protein 6.4 - 8.6 g/dL -  Total Bilirubin 0.2 - 1.0 mg/dL -  Alkaline Phos 82 - 469169 Unit/L -  AST 0 - 26 Unit/L -  ALT U/L -  Discharge instruction: per After Visit Summary.  Medications:    Medication List    TAKE these medications   ferrous sulfate 325 (65 FE) MG tablet Take 1 tablet (325 mg total) by mouth 2 (two) times daily with a meal.   HYDROcodone-acetaminophen 5-325 MG tablet Commonly known as:  NORCO/VICODIN Take 1 tablet by mouth every 4 (four) hours as needed for moderate pain or severe pain.   ibuprofen 600 MG tablet Commonly known as:  ADVIL,MOTRIN Take 1 tablet (600 mg total) by mouth every 6 (six) hours.   medroxyPROGESTERone 150 MG/ML injection Commonly known as:  DEPO-PROVERA Inject 1 mL (150 mg total) into the muscle every 3 (three) months.   prenatal multivitamin Tabs tablet Take 1 tablet by mouth daily at 12 noon.        Diet: routine diet  Activity: Advance as tolerated. Pelvic rest for 6 weeks.   Outpatient follow up: Follow-up Information    Conard Novak, MD Follow up in 6 week(s).   Specialty:  Obstetrics and Gynecology Why:  postpartum check Contact information: [address] [place] [address] [phone]             Postpartum contraception: Depo Provera at 6 wks Rhogam Given postpartum: no Rubella vaccine given postpartum: no Varicella vaccine given postpartum: no TDaP given antepartum or postpartum: AP on 03/28/16  Newborn  Data: Live born female  Birth Weight: 8# 0oz APGAR: 9, 9 Baby: Tracie Meyer  Baby Feeding: Breast  Disposition:home with mother  SIGNED: Marta Antu, CNM 10:08 AM 05/31/16  Seen with L. Zachery Conch, SNM [facility]

## 2016-05-31 LAB — RPR: RPR Ser Ql: NONREACTIVE

## 2016-05-31 MED ORDER — MEDROXYPROGESTERONE ACETATE 150 MG/ML IM SUSP: 150.0000 mg | INTRAMUSCULAR | 3 refills | Status: DC

## 2016-05-31 MED ORDER — FERROUS SULFATE 325 (65 FE) MG PO TABS: 325.0000 mg | ORAL_TABLET | Freq: Two times a day (BID) | ORAL | 3 refills | Status: DC

## 2016-05-31 MED ORDER — IBUPROFEN 600 MG PO TABS: 600.0000 mg | ORAL_TABLET | Freq: Four times a day (QID) | ORAL | 0 refills | Status: DC

## 2016-05-31 MED ORDER — HYDROCODONE-ACETAMINOPHEN 5-325 MG PO TABS: 1.0000 | ORAL_TABLET | ORAL | 0 refills | Status: DC | PRN

## 2016-05-31 NOTE — Progress Notes (Signed)
All discharge instructions given to patient and she voices understanding of all instructions given she will make her own 6 wk f/u appt. Prescriptions given to her by provider. Patient discharged home with infant escorted out by volunteer  in wheelchair

## 2016-05-31 NOTE — Discharge Instructions (Signed)
Follow up sooner with fever, problems breathing, pain not helped by medications, severe depression( more than just baby blues, wanting to hurt yourself or the baby), severe bleeding ( saturating more than one pad an hour or large palm sized clots), no heavy lifting , no driving while taking narcotics, no douches, intercourse, tampons or enemas for 6 weeks  °

## 2016-08-18 NOTE — Progress Notes (Deleted)
GYNECOLOGY ANNUAL PHYSICAL EXAM PROGRESS NOTE  Subjective:    Tracie Meyer is a 26 y.o. 812P2002 female who presents for an annual exam. The patient has no complaints today. The patient {is/is not/has never been:13135} sexually active. GYN screening history: {gyn screen history:13142}. The patient wears seatbelts: {yes/no:311178}. The patient participates in regular exercise: {yes/no/not asked:9010}. Has the patient ever been transfused or tattooed?: {yes/no/not asked:9010}. The patient reports that there {is/is not:9024} domestic violence in her life.    Gynecologic History No LMP recorded. Menstrual History: OB History    Gravida Para Term Preterm AB Living   2 2 2  0 0 2   SAB TAB Ectopic Multiple Live Births   0 0 0 0 2      Menarche age: *** No LMP recorded.    Contraception: {method:5051} History of STI's:  Last Pap: ***. Results were: {norm/abn:16337}.  ***Denies/Notes h/o abnormal pap smears. Last mammogram: ***. Results were: {norm/abn:16337}   Obstetric History   G2   P2   T2   P0   A0   L2    SAB0   TAB0   Ectopic0   Multiple0   Live Births2     # Outcome Date GA Lbr Len/2nd Weight Sex Delivery Anes PTL Lv  2 Term 05/29/16 2669w5d 1234:15 / 00:40 8 lb 0.4 oz (3.64 kg) M Vag-Spont EPI  LIV     Name: Carylon PerchesOLIVER,BOY [place]     Apgar1:  9                Apgar5: 9  1 Term 05/26/12   6 lb 12 oz (3.062 kg) M Vag-Spont EPI  LIV      No past medical history on file.  Past Surgical History:  Procedure Laterality Date  . APPENDECTOMY  2013    Family History  Problem Relation Age of Onset  . Cancer Maternal Grandmother     pancreatic  . Depression Maternal Grandfather     Social History   Social History  . Marital status: Single    Spouse name: N/A  . Number of children: N/A  . Years of education: N/A   Occupational History  . Not on file.   Social History Main Topics  . Smoking status: Former Smoker    Packs/day: 0.00  . Smokeless tobacco: Never Used    . Alcohol use No     Comment: Social   . Drug use: No  . Sexual activity: Yes    Birth control/ protection: None   Other Topics Concern  . Not on file   Social History Narrative  . No narrative on file    Current Outpatient Prescriptions on File Prior to Visit  Medication Sig Dispense Refill  . ferrous sulfate 325 (65 FE) MG tablet Take 1 tablet (325 mg total) by mouth 2 (two) times daily with a meal. 30 tablet 3  . HYDROcodone-acetaminophen (NORCO/VICODIN) 5-325 MG tablet Take 1 tablet by mouth every 4 (four) hours as needed for moderate pain or severe pain. 30 tablet 0  . ibuprofen (ADVIL,MOTRIN) 600 MG tablet Take 1 tablet (600 mg total) by mouth every 6 (six) hours. 30 tablet 0  . medroxyPROGESTERone (DEPO-PROVERA) 150 MG/ML injection Inject 1 mL (150 mg total) into the muscle every 3 (three) months. 1 mL 3  . medroxyPROGESTERone (DEPO-PROVERA) 150 MG/ML injection Inject 1 mL (150 mg total) into the muscle every 3 (three) months. 1 mL 3  . Prenatal Vit-Fe Fumarate-FA (PRENATAL MULTIVITAMIN) TABS  tablet Take 1 tablet by mouth daily at 12 noon.     No current facility-administered medications on file prior to visit.     Allergies  Allergen Reactions  . No Known Allergies Other (See Comments)      Review of Systems Constitutional: negative for chills, fatigue, fevers and sweats Eyes: negative for irritation, redness and visual disturbance Ears, nose, mouth, throat, and face: negative for hearing loss, nasal congestion, snoring and tinnitus Respiratory: negative for asthma, cough, sputum Cardiovascular: negative for chest pain, dyspnea, exertional chest pressure/discomfort, irregular heart beat, palpitations and syncope Gastrointestinal: negative for abdominal pain, change in bowel habits, nausea and vomiting Genitourinary: negative for abnormal menstrual periods, genital lesions, sexual problems and vaginal discharge, dysuria and urinary incontinence Integument/breast:  negative for breast lump, breast tenderness and nipple discharge Hematologic/lymphatic: negative for bleeding and easy bruising Musculoskeletal:negative for back pain and muscle weakness Neurological: negative for dizziness, headaches, vertigo and weakness Endocrine: negative for diabetic symptoms including polydipsia, polyuria and skin dryness Allergic/Immunologic: negative for hay fever and urticaria        Objective:  unknown if currently breastfeeding. There is no height or weight on file to calculate BMI.     General Appearance:    Alert, cooperative, no distress, appears stated age  Head:    Normocephalic, without obvious abnormality, atraumatic  Eyes:    PERRL, conjunctiva/corneas clear, EOM's intact, both eyes  Ears:    Normal external ear canals, both ears  Nose:   Nares normal, septum midline, mucosa normal, no drainage or sinus tenderness  Throat:   Lips, mucosa, and tongue normal; teeth and gums normal  Neck:   Supple, symmetrical, trachea midline, no adenopathy; thyroid: no enlargement/tenderness/nodules; no carotid bruit or JVD  Back:     Symmetric, no curvature, ROM normal, no CVA tenderness  Lungs:     Clear to auscultation bilaterally, respirations unlabored  Chest Wall:    No tenderness or deformity   Heart:    Regular rate and rhythm, S1 and S2 normal, no murmur, rub or gallop  Breast Exam:    No tenderness, masses, or nipple abnormality  Abdomen:     Soft, non-tender, bowel sounds active all four quadrants, no masses, no organomegaly.    Genitalia:    Pelvic:external genitalia normal, vagina without lesions, discharge, or tenderness, rectovaginal septum  normal. Cervix normal in appearance, no cervical motion tenderness, no adnexal masses or tenderness.  Uterus normal size, shape, mobile, regular contours, nontender.  Rectal:    Normal external sphincter.  No hemorrhoids appreciated. Internal exam not done.   Extremities:   Extremities normal, atraumatic, no cyanosis  or edema  Pulses:   2+ and symmetric all extremities  Skin:   Skin color, texture, turgor normal, no rashes or lesions  Lymph nodes:   Cervical, supraclavicular, and axillary nodes normal  Neurologic:   CNII-XII intact, normal strength, sensation and reflexes throughout   .  Labs:  Lab Results  Component Value Date   WBC 13.1 (H) 05/30/2016   HGB 10.0 (L) 05/30/2016   HCT 29.7 (L) 05/30/2016   MCV 88.5 05/30/2016   PLT 84 (L) 05/30/2016    Lab Results  Component Value Date   CREATININE 0.61 08/16/2015   BUN 13 08/16/2015   NA 142 08/16/2015   K 4.1 08/16/2015   CL 102 08/16/2015   CO2 22 08/16/2015    Lab Results  Component Value Date   ALT 22 12/30/2011   AST 20 12/30/2011  ALKPHOS 55 (L) 12/30/2011   BILITOT 0.5 12/30/2011    No results found for: TSH   Assessment:    Healthy female exam.    Plan:     {gyn plan:13146}     Debbe Bales, CMA [facility]

## 2016-08-19 ENCOUNTER — Encounter: Payer: Medicaid Other | Admitting: Obstetrics and Gynecology

## 2017-11-10 ENCOUNTER — Emergency Department
Admission: EM | Admit: 2017-11-10 | Discharge: 2017-11-10 | Disposition: A | Discharge status: Home / Self Care | Payer: Self-pay | Attending: Emergency Medicine | Admitting: Emergency Medicine

## 2017-11-10 ENCOUNTER — Other Ambulatory Visit: Payer: Self-pay

## 2017-11-10 DIAGNOSIS — R1084 Generalized abdominal pain: Secondary | ICD-10-CM | POA: Insufficient documentation

## 2017-11-10 DIAGNOSIS — F1290 Cannabis use, unspecified, uncomplicated: Secondary | ICD-10-CM | POA: Insufficient documentation

## 2017-11-10 DIAGNOSIS — R569 Unspecified convulsions: Secondary | ICD-10-CM | POA: Insufficient documentation

## 2017-11-10 DIAGNOSIS — Z87891 Personal history of nicotine dependence: Secondary | ICD-10-CM | POA: Insufficient documentation

## 2017-11-10 LAB — URINALYSIS, COMPLETE (UACMP) WITH MICROSCOPIC
Bilirubin Urine: NEGATIVE
GLUCOSE, UA: NEGATIVE mg/dL
Ketones, ur: NEGATIVE mg/dL
Leukocytes, UA: NEGATIVE
NITRITE: NEGATIVE
Protein, ur: NEGATIVE mg/dL
Specific Gravity, Urine: 1.016 (ref 1.005–1.030)
pH: 6 (ref 5.0–8.0)

## 2017-11-10 LAB — CBC
HCT: 40.6 % (ref 35.0–47.0)
HEMOGLOBIN: 13.4 g/dL (ref 12.0–16.0)
MCH: 29.9 pg (ref 26.0–34.0)
MCHC: 33 g/dL (ref 32.0–36.0)
MCV: 90.6 fL (ref 80.0–100.0)
Platelets: 155 10*3/uL (ref 150–440)
RBC: 4.48 MIL/uL (ref 3.80–5.20)
RDW: 13.9 % (ref 11.5–14.5)
WBC: 7.5 10*3/uL (ref 3.6–11.0)

## 2017-11-10 LAB — BASIC METABOLIC PANEL
ANION GAP: 5 (ref 5–15)
BUN: 18 mg/dL (ref 6–20)
CALCIUM: 9.4 mg/dL (ref 8.9–10.3)
CO2: 29 mmol/L (ref 22–32)
Chloride: 107 mmol/L (ref 101–111)
Creatinine, Ser: 0.72 mg/dL (ref 0.44–1.00)
GFR calc Af Amer: >60 mL/min (ref >60)
GLUCOSE: 69 mg/dL (ref 65–99)
Potassium: 3.7 mmol/L (ref 3.5–5.1)
Sodium: 141 mmol/L (ref 135–145)

## 2017-11-10 LAB — PREGNANCY, URINE: Preg Test, Ur: NEGATIVE

## 2017-11-10 NOTE — Discharge Instructions (Addendum)
These make an appointment with the neurologist, Dr. Sherryll BurgerShah, for further evaluation.  Please take all seizure precautions like we discussed, including not driving, not taking care of small children by herself, and not putting herself in any unsafe positions to prevent falls if you have another episode.  Please do not use marijuana, as it may have contributed to your symptoms.  Return to the emergency department if you develop severe pain, seizure or seizure-like activity, fainting, fever, or any other symptoms concerning to you.

## 2017-11-10 NOTE — ED Provider Notes (Signed)
[facility] Emergency Department Provider Note  ____________________________________________  Time seen: Approximately 3:08 PM  I have reviewed the triage vital signs and the nursing notes.   HISTORY  Chief Complaint Seizures    HPI Tracie Meyer is a 27 y.o. female with a history of prior seizure-like activity presenting with an episode of unknown clear etiology.  The patient reports that 2 nights ago, she was sitting on the couch when she developed abdominal pain "like bad gas," followed by a loss of consciousness witnessed by her husband.  Her husband is here and describes that the entirety of the episode lasted approximately 1-2 minutes.  For the first minute, her arms through into her chest, her eyes rolled back, and she was very tense.  After that, her whole body relax.  She did not have a postictal state, any tongue injury, any urinary or fecal incontinence.  She did not have any preceding palpitations, lightheadedness.  She has not had any recent illness or trauma.  He has experienced 2 prior episodes that were also associated with abdominal pain when she was 27 years old, and then again 4 years ago.  She did not have febrile seizures as a child and there is no epilepsy history in her family.  SH: Ongoing tobacco abuse and occasional marijuana use, last approximately 2 weeks ago.  No past medical history on file.  Patient Active Problem List   Diagnosis Date Noted  . Labor and delivery, indication for care 04/17/2016  . Preterm contractions 04/14/2016  . Preterm labor in second trimester with preterm delivery in third trimester 04/08/2016  . Fall (on) (from) other stairs and steps, initial encounter 02/05/2016    Past Surgical History:  Procedure Laterality Date  . APPENDECTOMY  2013    Current Outpatient Rx  . Order #: [id number] Class: Print  . Order #: [id number] Class: Print  . Order #: [id number] Class: Print  . Order #: [id number] Class: Normal  .  Order #: [id number] Class: Print  . Order #: [id number] Class: Historical Med    Allergies No known allergies  Family History  Problem Relation Age of Onset  . Cancer Maternal Grandmother        pancreatic  . Depression Maternal Grandfather     Social History Social History   Tobacco Use  . Smoking status: Former Smoker    Packs/day: 0.00  . Smokeless tobacco: Never Used  Substance Use Topics  . Alcohol use: No    Comment: Social   . Drug use: No    Review of Systems Constitutional: No fever/chills.  No lightheadedness.  Positive loss of consciousness. Eyes: No visual changes.  No blurred or double vision. ENT: No sore throat. No congestion or rhinorrhea. Cardiovascular: Denies chest pain. Denies palpitations. Respiratory: Denies shortness of breath.  No cough. Gastrointestinal: Positive abdominal pain.  No nausea, no vomiting.  No diarrhea.  No constipation. Genitourinary: Negative for dysuria. Musculoskeletal: Negative for back pain. Skin: Negative for rash. Neurological: Negative for headaches. No focal numbness, tingling or weakness.  Positive contracting of the upper extremities and eyes rolling back. Psychiatric:Positive marijuana use.  Denies cocaine.  ____________________________________________   PHYSICAL EXAM:  VITAL SIGNS: ED Triage Vitals  Enc Vitals Group     BP 11/10/17 1249 116/65     Pulse Rate 11/10/17 1249 66     Resp 11/10/17 1249 18     Temp 11/10/17 1249 98.1 F (36.7 C)     Temp Source 11/10/17 1249 Oral  SpO2 11/10/17 1249 100 %     Weight 11/10/17 1249 112 lb (50.8 kg)     Height 11/10/17 1249 5\' 7"  (1.702 m)     Head Circumference --      Peak Flow --      Pain Score 11/10/17 1300 5     Pain Loc --      Pain Edu? --      Excl. in GC? --     Constitutional: Alert and oriented. Well appearing and in no acute distress. Answers questions appropriately. Eyes: Conjunctivae are normal.  EOMI. PERRLA.  No scleral icterus. Head:  Atraumatic. Nose: No congestion/rhinnorhea. Mouth/Throat: Mucous membranes are moist.  Neck: No stridor.  Supple.   Cardiovascular: Normal rate, regular rhythm. No murmurs, rubs or gallops.  Respiratory: Normal respiratory effort.  No accessory muscle use or retractions. Lungs CTAB.  No wheezes, rales or ronchi. Gastrointestinal: Soft, nontender and nondistended.  No guarding or rebound.  No peritoneal signs. Musculoskeletal: No LE edema. No ttp in the calves or palpable cords.  Negative Homan's sign. Neurologic: Alert and oriented 3. Speech is clear. Face and smile symmetric. Tongue is midline.  EOMI.  PERRLA.  No pronator drift. 5 out of 5 grip, biceps, triceps, hip flexors, plantar flexion and dorsiflexion. Normal sensation to light touch in the bilateral upper and lower extremities, and face. Normal gait without ataxia. Skin:  Skin is warm, dry and intact. No rash noted. Psychiatric: Mood and affect are normal. Speech and behavior are normal.  Normal judgement.`  ____________________________________________   LABS (all labs ordered are listed, but only abnormal results are displayed)  Labs Reviewed  BASIC METABOLIC PANEL  CBC  POC URINE PREG, ED   ____________________________________________  EKG  ED ECG REPORT I, Rockne Menghini, the attending physician, personally viewed and interpreted this ECG.   Date: 11/10/2017  EKG Time: 1531  Rate: 59  Rhythm: sinus bradycardia  Axis: normal  Intervals:none  ST&T Change: No STEMI  ____________________________________________  RADIOLOGY  No results found.  ____________________________________________   PROCEDURES  Procedure(s) performed: None  Procedures  Critical Care performed: No ____________________________________________   INITIAL IMPRESSION / ASSESSMENT AND PLAN / ED COURSE  Pertinent labs & imaging results that were available during my care of the patient were reviewed by me and considered in my  medical decision making (see chart for details).  27 y.o. female with prior histories of uncharacterized episodes of tonic activity in the setting of abdominal pain presenting with a brief episode of loss of consciousness and upper extremity contractions, eyes rolled back, 2 days ago.  Overall, the patient is hematin and medically stable and afebrile.  Her episode is concerning for syncope which may be vasovagal given her abdominal pain, seizure is also possible.  The patient has no focal deficits on neurologic examination no headaches, CVA or intracranial mass is much less likely.  She does use marijuana, and drug related seizure is possible but also less likely.  Today, the patient's laboratory studies including blood counts and electrolytes are reassuring.  I have given the patient seizure precautions including discussing caretaking of her children and driving, and will plan to have her follow-up with neurology as an outpatient.  There is no indication for imaging today.  The patient and her husband understand follow-up instructions as well as return precautions and that had a chance to ask questions.  ____________________________________________  FINAL CLINICAL IMPRESSION(S) / ED DIAGNOSES  Final diagnoses:  Seizure-like activity (HCC)  Generalized abdominal pain  Marijuana use         NEW MEDICATIONS STARTED DURING THIS VISIT:  New Prescriptions   No medications on file      Rockne Menghini, MD 11/10/17 1547

## 2017-11-10 NOTE — ED Triage Notes (Signed)
Pt to ED reporting a seizure on Sunday for 2 minutes per family. Family reports pts arms came into chest and she shook for the entire two minutes. Pt reports she has had arm pain and a headache since Sunday. No neuro deficits at this time. No medications taken. Pt reports having had a few seizures in the past.

## 2017-11-17 ENCOUNTER — Emergency Department
Admission: EM | Admit: 2017-11-17 | Discharge: 2017-11-17 | Disposition: A | Discharge status: Home / Self Care | Payer: Self-pay | Attending: Emergency Medicine | Admitting: Emergency Medicine

## 2017-11-17 ENCOUNTER — Other Ambulatory Visit: Payer: Self-pay

## 2017-11-17 ENCOUNTER — Encounter: Payer: Self-pay | Admitting: Emergency Medicine

## 2017-11-17 DIAGNOSIS — Z79899 Other long term (current) drug therapy: Secondary | ICD-10-CM | POA: Insufficient documentation

## 2017-11-17 DIAGNOSIS — R102 Pelvic and perineal pain: Secondary | ICD-10-CM | POA: Insufficient documentation

## 2017-11-17 DIAGNOSIS — F17200 Nicotine dependence, unspecified, uncomplicated: Secondary | ICD-10-CM | POA: Insufficient documentation

## 2017-11-17 DIAGNOSIS — A084 Viral intestinal infection, unspecified: Secondary | ICD-10-CM | POA: Insufficient documentation

## 2017-11-17 HISTORY — DX: Other specified personal risk factors, not elsewhere classified: Z91.89

## 2017-11-17 LAB — COMPREHENSIVE METABOLIC PANEL
ALBUMIN: 4.8 g/dL (ref 3.5–5.0)
ALT: 24 U/L (ref 14–54)
ANION GAP: 9 (ref 5–15)
AST: 36 U/L (ref 15–41)
Alkaline Phosphatase: 53 U/L (ref 38–126)
BUN: 22 mg/dL — ABNORMAL HIGH (ref 6–20)
CO2: 23 mmol/L (ref 22–32)
Calcium: 9.4 mg/dL (ref 8.9–10.3)
Chloride: 105 mmol/L (ref 101–111)
Creatinine, Ser: 0.71 mg/dL (ref 0.44–1.00)
GFR calc Af Amer: >60 mL/min (ref >60)
GFR calc non Af Amer: >60 mL/min (ref >60)
GLUCOSE: 127 mg/dL — AB (ref 65–99)
POTASSIUM: 3.8 mmol/L (ref 3.5–5.1)
SODIUM: 137 mmol/L (ref 135–145)
TOTAL PROTEIN: 7.8 g/dL (ref 6.5–8.1)
Total Bilirubin: 1.8 mg/dL — ABNORMAL HIGH (ref 0.3–1.2)

## 2017-11-17 LAB — CBC
HEMATOCRIT: 43.7 % (ref 35.0–47.0)
HEMOGLOBIN: 14.3 g/dL (ref 12.0–16.0)
MCH: 29.6 pg (ref 26.0–34.0)
MCHC: 32.7 g/dL (ref 32.0–36.0)
MCV: 90.5 fL (ref 80.0–100.0)
Platelets: 186 10*3/uL (ref 150–440)
RBC: 4.82 MIL/uL (ref 3.80–5.20)
RDW: 14 % (ref 11.5–14.5)
WBC: 15.2 10*3/uL — ABNORMAL HIGH (ref 3.6–11.0)

## 2017-11-17 LAB — LIPASE, BLOOD: Lipase: 30 U/L (ref 11–51)

## 2017-11-17 LAB — HCG, QUANTITATIVE, PREGNANCY: hCG, Beta Chain, Quant, S: <1 m[IU]/mL (ref <5)

## 2017-11-17 MED ORDER — SODIUM CHLORIDE 0.9 % IV BOLUS
1000.0000 mL | Freq: Once | INTRAVENOUS | Status: AC
  Administered 2017-11-17: 1000 mL via INTRAVENOUS

## 2017-11-17 MED ORDER — ONDANSETRON HCL 4 MG/2ML IJ SOLN
INTRAMUSCULAR | Status: AC
  Administered 2017-11-17: 4 mg via INTRAVENOUS
  Filled 2017-11-17: qty 2

## 2017-11-17 MED ORDER — KETOROLAC TROMETHAMINE 30 MG/ML IJ SOLN
15.0000 mg | Freq: Once | INTRAMUSCULAR | Status: AC
  Administered 2017-11-17: 15 mg via INTRAVENOUS
  Filled 2017-11-17: qty 1

## 2017-11-17 MED ORDER — ONDANSETRON 4 MG PO TBDP: 4.0000 mg | ORAL_TABLET | Freq: Three times a day (TID) | ORAL | 0 refills | Status: DC | PRN

## 2017-11-17 MED ORDER — ONDANSETRON HCL 4 MG/2ML IJ SOLN
4.0000 mg | Freq: Once | INTRAMUSCULAR | Status: AC | PRN
  Administered 2017-11-17: 4 mg via INTRAVENOUS

## 2017-11-17 MED ORDER — METOCLOPRAMIDE HCL 5 MG/ML IJ SOLN
10.0000 mg | Freq: Once | INTRAMUSCULAR | Status: AC
  Administered 2017-11-17: 10 mg via INTRAVENOUS
  Filled 2017-11-17: qty 2

## 2017-11-17 NOTE — ED Notes (Signed)
Patient not making direct eye contact with Dr. Don PerkingVeronese during reassessment, glanced at husband.  Dr. Don PerkingVeronese asked husband to step out of room to speak to patient privately, husband refused.  Patient stated feels safe to return home at this time.

## 2017-11-17 NOTE — ED Provider Notes (Signed)
[facility] Emergency Department Provider Note  ____________________________________________  Time seen: Approximately 3:10 PM  I have reviewed the triage vital signs and the nursing notes.   HISTORY  Chief Complaint Emesis and Diarrhea   HPI Tracie Meyer is a 27 y.o. female presents for evaluation of abdominal pain, vomiting and diarrhea.  Patient reports that her symptoms started 6 AM this morning.  She has had 12 episodes of nonbloody nonbilious emesis and 3 of watery diarrhea.  No melena, no coffee-ground emesis, no hematemesis.  She is also complaining of severe cramping in her abdomen, worse on the left side, radiating to her back.  She denies fever or chills, vaginal bleeding, dysuria, hematuria, vaginal discharge. Both patient's kids have had similar symptoms in the last 24 hours.   Past Medical History:  Diagnosis Date  . History of fainting spells of unknown cause     Patient Active Problem List   Diagnosis Date Noted  . Labor and delivery, indication for care 04/17/2016  . Preterm contractions 04/14/2016  . Preterm labor in second trimester with preterm delivery in third trimester 04/08/2016  . Fall (on) (from) other stairs and steps, initial encounter 02/05/2016    Past Surgical History:  Procedure Laterality Date  . APPENDECTOMY  2013    Prior to Admission medications   Medication Sig Start Date End Date Taking? Authorizing Provider  ferrous sulfate 325 (65 FE) MG tablet Take 1 tablet (325 mg total) by mouth 2 (two) times daily with a meal. 05/31/16   Brothers, Delaney Meigsamara, CNM  HYDROcodone-acetaminophen (NORCO/VICODIN) 5-325 MG tablet Take 1 tablet by mouth every 4 (four) hours as needed for moderate pain or severe pain. 05/31/16   Brothers, Delaney Meigsamara, CNM  ibuprofen (ADVIL,MOTRIN) 600 MG tablet Take 1 tablet (600 mg total) by mouth every 6 (six) hours. 05/31/16   Marta AntuBrothers, Tamara, CNM  medroxyPROGESTERone (DEPO-PROVERA) 150 MG/ML injection  Inject 1 mL (150 mg total) into the muscle every 3 (three) months. 05/31/16   Marta AntuBrothers, Tamara, CNM  medroxyPROGESTERone (DEPO-PROVERA) 150 MG/ML injection Inject 1 mL (150 mg total) into the muscle every 3 (three) months. 05/31/16   Brothers, Delaney Meigsamara, CNM  ondansetron (ZOFRAN ODT) 4 MG disintegrating tablet Take 1 tablet (4 mg total) by mouth every 8 (eight) hours as needed for nausea or vomiting. 11/17/17   Nita SickleVeronese, [place], MD  Prenatal Vit-Fe Fumarate-FA (PRENATAL MULTIVITAMIN) TABS tablet Take 1 tablet by mouth daily at 12 noon.    [provider]    Allergies No known allergies  Family History  Problem Relation Age of Onset  . Cancer Maternal Grandmother        pancreatic  . Depression Maternal Grandfather     Social History Social History   Tobacco Use  . Smoking status: Current Every Day Smoker    Packs/day: 0.50  . Smokeless tobacco: Never Used  Substance Use Topics  . Alcohol use: No    Comment: Social   . Drug use: No    Review of Systems  Constitutional: Negative for fever. Eyes: Negative for visual changes. ENT: Negative for sore throat. Neck: No neck pain  Cardiovascular: Negative for chest pain. Respiratory: Negative for shortness of breath. Gastrointestinal: +abdominal pain, vomiting and diarrhea. Genitourinary: Negative for dysuria. Musculoskeletal: Negative for back pain. Skin: Negative for rash. Neurological: Negative for headaches, weakness or numbness. Psych: No SI or HI  ____________________________________________   PHYSICAL EXAM:  VITAL SIGNS: ED Triage Vitals  Enc Vitals Group  BP 11/17/17 1347 (!) 111/54     Pulse Rate 11/17/17 1347 84     Resp 11/17/17 1347 20     Temp 11/17/17 1347 (!) 97.5 F (36.4 C)     Temp Source 11/17/17 1347 Oral     SpO2 11/17/17 1347 100 %     Weight 11/17/17 1349 112 lb (50.8 kg)     Height 11/17/17 1349 5\' 7"  (1.702 m)     Head Circumference --      Peak Flow --      Pain Score 11/17/17  1349 0     Pain Loc --      Pain Edu? --      Excl. in GC? --     Constitutional: Alert and oriented, patient is crying and moaning holding the left side of her abdomen, in significant distress.  HEENT:      Head: Normocephalic and atraumatic.         Eyes: Conjunctivae are normal. Sclera is non-icteric.       Mouth/Throat: Mucous membranes are dry.       Neck: Supple with no signs of meningismus. Cardiovascular: Regular rate and rhythm. No murmurs, gallops, or rubs. 2+ symmetrical distal pulses are present in all extremities. No JVD. Respiratory: Normal respiratory effort. Lungs are clear to auscultation bilaterally. No wheezes, crackles, or rhonchi.  Gastrointestinal: Soft, diffusely tender to palpation, non distended with positive bowel sounds. No rebound or guarding. Genitourinary: No CVA tenderness. Musculoskeletal: Nontender with normal range of motion in all extremities. No edema, cyanosis, or erythema of extremities. Neurologic: Normal speech and language. Face is symmetric. Moving all extremities. No gross focal neurologic deficits are appreciated. Skin: Skin is warm, dry and intact. No rash noted. Psychiatric: Mood and affect are normal. Speech and behavior are normal.  ____________________________________________   LABS (all labs ordered are listed, but only abnormal results are displayed)  Labs Reviewed  COMPREHENSIVE METABOLIC PANEL - Abnormal; Notable for the following components:      Result Value   Glucose, Bld 127 (*)    BUN 22 (*)    Total Bilirubin 1.8 (*)    All other components within normal limits  CBC - Abnormal; Notable for the following components:   WBC 15.2 (*)    All other components within normal limits  LIPASE, BLOOD  URINALYSIS, COMPLETE (UACMP) WITH MICROSCOPIC  HCG, QUANTITATIVE, PREGNANCY  POC URINE PREG, ED   ____________________________________________  EKG  none ____________________________________________  RADIOLOGY  none    ____________________________________________   PROCEDURES  Procedure(s) performed: None Procedures Critical Care performed:  None ____________________________________________   INITIAL IMPRESSION / ASSESSMENT AND PLAN / ED COURSE   27 y.o. female presents for evaluation of abdominal pain, vomiting and diarrhea since 6 AM this morning.  Patient is in significant distress, holding her abdomen, crying and moaning, very limited history given due to her distress.  She looks dry on exam, her vitals are within normal limits, her abdomen is soft with diffuse tenderness throughout, no rebound or guarding.  Patient received a liter of fluid and Zofran prior to my evaluation and continues to feel sick.  Had 2 episodes of vomiting since the Zofran and is currently having a bowel movement.  Labs show normal CMP, lipase. CBC showing leukocytosis of 15.2. hcg and UA pending. Will give Reglan, second liter fluids, and Toradol.  Ddx gastroenteritis vs colitis vs enteritis vs ectopic vs pancreatitis vs GB vs ovarian pathology vs kidney stone vs UTI  Clinical  Course as of Nov 18 1602  Tue Nov 17, 2017  1601 Patient requested to speak with me at this time.  She reports that she feels better and would like to be discharged at this time.  I explained to her that her hCG is not back yet and I wish that she would be able to finish the second bolus that is hanging in the room.  Patient says that she will be okay and she wants to be discharged.  She does not want to wait for the results of the pregnancy test.  I will provide her with a prescription for Zofran.  Reevaluation of her abdomen now that the patient looks better shows no tenderness. She denies abdominal pain.  She is tolerating p.o.   [CV]    Clinical Course User Index [CV] Don Perking [place], MD     As part of my medical decision making, I reviewed the following data within the electronic MEDICAL RECORD NUMBER Nursing notes reviewed and incorporated, Labs  reviewed , Notes from prior ED visits and [place] Controlled Substance Database    Pertinent labs & imaging results that were available during my care of the patient were reviewed by me and considered in my medical decision making (see chart for details).    ____________________________________________   FINAL CLINICAL IMPRESSION(S) / ED DIAGNOSES  Final diagnoses:  Viral gastroenteritis      NEW MEDICATIONS STARTED DURING THIS VISIT:  ED Discharge Orders        Ordered    ondansetron (ZOFRAN ODT) 4 MG disintegrating tablet  Every 8 hours PRN     11/17/17 1603       Note:  This document was prepared using Dragon voice recognition software and may include unintentional dictation errors.    Nita Sickle, MD 11/17/17 [phone]

## 2017-11-17 NOTE — ED Notes (Addendum)
Patient demanding IV be taken out so that she can leave.  States "I feel fine".  MD at bedside assessing patient. VORB to stop fluids at total of .  IV removed.  Patient refusing discharge vitals.

## 2017-11-17 NOTE — ED Triage Notes (Signed)
Pt to ED from home c/o n/v since 0600 today x10-12 episodes, kids sick at home with vomiting yesterday.  Denies pain.

## 2017-11-17 NOTE — ED Notes (Signed)
Pt presents with n/v/d since 0630. Pt writhing and crying during assessment. STates her back and her stomach and her chest all hurt. Pt alert & oriented. Pt requested to go to the toilet and began saying her legs hurt and she felt like she might fall down. Pt wobbly, assisted to toilet.

## 2018-08-26 ENCOUNTER — Other Ambulatory Visit: Payer: Self-pay

## 2018-08-26 ENCOUNTER — Emergency Department
Admission: EM | Admit: 2018-08-26 | Discharge: 2018-08-26 | Disposition: A | Discharge status: Home / Self Care | Payer: Self-pay | Attending: Emergency Medicine | Admitting: Emergency Medicine

## 2018-08-26 DIAGNOSIS — F17210 Nicotine dependence, cigarettes, uncomplicated: Secondary | ICD-10-CM | POA: Insufficient documentation

## 2018-08-26 DIAGNOSIS — Z79899 Other long term (current) drug therapy: Secondary | ICD-10-CM | POA: Insufficient documentation

## 2018-08-26 DIAGNOSIS — J020 Streptococcal pharyngitis: Secondary | ICD-10-CM | POA: Insufficient documentation

## 2018-08-26 LAB — GROUP A STREP BY PCR: Group A Strep by PCR: DETECTED — AB

## 2018-08-26 MED ORDER — METHYLPREDNISOLONE 4 MG PO TBPK: ORAL_TABLET | ORAL | 0 refills | Status: DC

## 2018-08-26 MED ORDER — AMOXICILLIN 875 MG PO TABS: 875.0000 mg | ORAL_TABLET | Freq: Two times a day (BID) | ORAL | 0 refills | Status: DC

## 2018-08-26 NOTE — ED Triage Notes (Signed)
Pt c/o having right tonsil pain with swelling since yesterday

## 2018-08-26 NOTE — ED Notes (Signed)
See triage note   Presents with sore throat  And low grade temp

## 2018-08-26 NOTE — Discharge Instructions (Addendum)
Follow-up with your regular doctor or in acute care in 3 days if not better.  Return emergency department if worsening.  Take medication as prescribed.  Drink plenty of fluids to keep yourself hydrated.  Discard her toothbrush in 3 days so you will not reinfect yourself

## 2018-08-26 NOTE — ED Provider Notes (Signed)
[facility] Emergency Department Provider Note  ____________________________________________   First MD Initiated Contact with Patient 08/26/18 1040     (approximate)  I have reviewed the triage vital signs and the nursing notes.   HISTORY  Chief Complaint Sore Throat (right sided only)    HPI Tracie Meyer is a 28 y.o. female presents emergency department complaint of sore throat x1 day.  She has low-grade temp.  She states all of her symptoms started yesterday and she noticed that the right tonsil seemed to be very swollen.  She denies any vomiting, diarrhea, cough or congestion.    Past Medical History:  Diagnosis Date  . History of fainting spells of unknown cause     Patient Active Problem List   Diagnosis Date Noted  . Labor and delivery, indication for care 04/17/2016  . Preterm contractions 04/14/2016  . Preterm labor in second trimester with preterm delivery in third trimester 04/08/2016  . Fall (on) (from) other stairs and steps, initial encounter 02/05/2016    Past Surgical History:  Procedure Laterality Date  . APPENDECTOMY  2013    Prior to Admission medications   Medication Sig Start Date End Date Taking? Authorizing Provider  amoxicillin (AMOXIL) 875 MG tablet Take 1 tablet (875 mg total) by mouth 2 (two) times daily. 08/26/18   Sherrie Mustache Roselyn Bering, PA-C  ferrous sulfate 325 (65 FE) MG tablet Take 1 tablet (325 mg total) by mouth 2 (two) times daily with a meal. 05/31/16   Brothers, Delaney Meigs, CNM  methylPREDNISolone (MEDROL DOSEPAK) 4 MG TBPK tablet Take 6 pills on day one then decrease by 1 pill each day 08/26/18   Faythe Ghee, PA-C    Allergies No known allergies  Family History  Problem Relation Age of Onset  . Cancer Maternal Grandmother        pancreatic  . Depression Maternal Grandfather     Social History Social History   Tobacco Use  . Smoking status: Current Every Day Smoker    Packs/day: 0.50  . Smokeless  tobacco: Never Used  Substance Use Topics  . Alcohol use: No    Comment: Social   . Drug use: No    Review of Systems  Constitutional: Positive fever/chills Eyes: No visual changes. ENT: Positive sore throat. Respiratory: Denies cough Genitourinary: Negative for dysuria. Musculoskeletal: Negative for back pain. Skin: Negative for rash.    ____________________________________________   PHYSICAL EXAM:  VITAL SIGNS: ED Triage Vitals [08/26/18 0940]  Enc Vitals Group     BP 121/60     Pulse Rate 95     Resp 16     Temp 99 F (37.2 C)     Temp Source Oral     SpO2 100 %     Weight 108 lb (49 kg)     Height 5\' 7"  (1.702 m)     Head Circumference      Peak Flow      Pain Score 8     Pain Loc      Pain Edu?      Excl. in GC?     Constitutional: Alert and oriented. Well appearing and in no acute distress. Eyes: Conjunctivae are normal.  Head: Atraumatic. Nose: No congestion/rhinnorhea. Mouth/Throat: Mucous membranes are moist.  Tonsils are red and swollen, the right tonsil is more enlarged than the left Neck:  supple cervical lymphadenopathy noted Cardiovascular: Normal rate, regular rhythm. Heart sounds are normal Respiratory: Normal respiratory effort.  No retractions,  lungs c t a  GU: deferred Musculoskeletal: FROM all extremities, warm and well perfused Neurologic:  Normal speech and language.  Skin:  Skin is warm, dry and intact. No rash noted. Psychiatric: Mood and affect are normal. Speech and behavior are normal.  ____________________________________________   LABS (all labs ordered are listed, but only abnormal results are displayed)  Labs Reviewed  GROUP A STREP BY PCR - Abnormal; Notable for the following components:      Result Value   Group A Strep by PCR DETECTED (*)    All other components within normal limits    ____________________________________________   ____________________________________________  RADIOLOGY    ____________________________________________   PROCEDURES  Procedure(s) performed: No  Procedures    ____________________________________________   INITIAL IMPRESSION / ASSESSMENT AND PLAN / ED COURSE  Pertinent labs & imaging results that were available during my care of the patient were reviewed by me and considered in my medical decision making (see chart for details).   Patient is a 28 year old female presents emergency department with sore throat.  Physical exam shows a red swollen throat with more swelling noted on the right tonsil.  Strep test is positive.  Patient was given a prescription for amoxicillin and a Medrol Dosepak.  She is to follow-up with regular doctor if not better in 3 days.  Return emergency department worsening.  She is to increase fluids.  Return if unable to swallow liquids.  She states she understands and will comply.  She is discharged in stable condition.     As part of my medical decision making, I reviewed the following data within the electronic MEDICAL RECORD NUMBER Nursing notes reviewed and incorporated, Labs reviewed strep test is positive, Notes from prior ED visits and [place] Controlled Substance Database  ____________________________________________   FINAL CLINICAL IMPRESSION(S) / ED DIAGNOSES  Final diagnoses:  Acute streptococcal pharyngitis      NEW MEDICATIONS STARTED DURING THIS VISIT:  New Prescriptions   AMOXICILLIN (AMOXIL) 875 MG TABLET    Take 1 tablet (875 mg total) by mouth 2 (two) times daily.   METHYLPREDNISOLONE (MEDROL DOSEPAK) 4 MG TBPK TABLET    Take 6 pills on day one then decrease by 1 pill each day     Note:  This document was prepared using Dragon voice recognition software and may include unintentional dictation errors.    Faythe Ghee, PA-C 08/26/18 1115    Jene Every, MD 08/26/18  1455

## 2019-11-25 ENCOUNTER — Emergency Department
Admission: EM | Admit: 2019-11-25 | Discharge: 2019-11-25 | Disposition: A | Discharge status: Home / Self Care | Payer: Self-pay | Attending: Emergency Medicine | Admitting: Emergency Medicine

## 2019-11-25 ENCOUNTER — Other Ambulatory Visit: Payer: Self-pay

## 2019-11-25 ENCOUNTER — Emergency Department: Payer: Self-pay

## 2019-11-25 ENCOUNTER — Encounter: Payer: Self-pay | Admitting: Emergency Medicine

## 2019-11-25 DIAGNOSIS — O468X1 Other antepartum hemorrhage, first trimester: Secondary | ICD-10-CM | POA: Insufficient documentation

## 2019-11-25 DIAGNOSIS — F17200 Nicotine dependence, unspecified, uncomplicated: Secondary | ICD-10-CM | POA: Insufficient documentation

## 2019-11-25 DIAGNOSIS — Z79899 Other long term (current) drug therapy: Secondary | ICD-10-CM | POA: Insufficient documentation

## 2019-11-25 DIAGNOSIS — O99331 Smoking (tobacco) complicating pregnancy, first trimester: Secondary | ICD-10-CM | POA: Insufficient documentation

## 2019-11-25 DIAGNOSIS — O418X11 Other specified disorders of amniotic fluid and membranes, first trimester, fetus 1: Secondary | ICD-10-CM | POA: Insufficient documentation

## 2019-11-25 DIAGNOSIS — O418X10 Other specified disorders of amniotic fluid and membranes, first trimester, not applicable or unspecified: Secondary | ICD-10-CM

## 2019-11-25 DIAGNOSIS — Z3A01 Less than 8 weeks gestation of pregnancy: Secondary | ICD-10-CM | POA: Insufficient documentation

## 2019-11-25 DIAGNOSIS — R102 Pelvic and perineal pain: Secondary | ICD-10-CM | POA: Insufficient documentation

## 2019-11-25 DIAGNOSIS — O26891 Other specified pregnancy related conditions, first trimester: Secondary | ICD-10-CM | POA: Insufficient documentation

## 2019-11-25 LAB — URINALYSIS, COMPLETE (UACMP) WITH MICROSCOPIC
Bilirubin Urine: NEGATIVE
Glucose, UA: NEGATIVE mg/dL
Ketones, ur: NEGATIVE mg/dL
Nitrite: NEGATIVE
Protein, ur: NEGATIVE mg/dL
Specific Gravity, Urine: 1.014 (ref 1.005–1.030)
pH: 7 (ref 5.0–8.0)

## 2019-11-25 LAB — BASIC METABOLIC PANEL
Anion gap: 7 (ref 5–15)
BUN: 12 mg/dL (ref 6–20)
CO2: 25 mmol/L (ref 22–32)
Calcium: 9.1 mg/dL (ref 8.9–10.3)
Chloride: 106 mmol/L (ref 98–111)
Creatinine, Ser: 0.59 mg/dL (ref 0.44–1.00)
GFR calc Af Amer: >60 mL/min (ref >60)
GFR calc non Af Amer: >60 mL/min (ref >60)
Glucose, Bld: 99 mg/dL (ref 70–99)
Potassium: 4.1 mmol/L (ref 3.5–5.1)
Sodium: 138 mmol/L (ref 135–145)

## 2019-11-25 LAB — CBC
HCT: 36.8 % (ref 36.0–46.0)
Hemoglobin: 12 g/dL (ref 12.0–15.0)
MCH: 30.5 pg (ref 26.0–34.0)
MCHC: 32.6 g/dL (ref 30.0–36.0)
MCV: 93.4 fL (ref 80.0–100.0)
Platelets: 161 10*3/uL (ref 150–400)
RBC: 3.94 MIL/uL (ref 3.87–5.11)
RDW: 12.5 % (ref 11.5–15.5)
WBC: 8.5 10*3/uL (ref 4.0–10.5)
nRBC: 0 % (ref 0.0–0.2)

## 2019-11-25 LAB — ABO/RH: ABO/RH(D): O POS

## 2019-11-25 LAB — HCG, QUANTITATIVE, PREGNANCY: hCG, Beta Chain, Quant, S: 18063 m[IU]/mL — ABNORMAL HIGH (ref <5)

## 2019-11-25 LAB — POCT PREGNANCY, URINE: Preg Test, Ur: POSITIVE — AB

## 2019-11-25 MED ORDER — ACETAMINOPHEN 325 MG PO TABS
650.0000 mg | ORAL_TABLET | Freq: Once | ORAL | Status: AC
  Administered 2019-11-25: 650 mg via ORAL
  Filled 2019-11-25: qty 2

## 2019-11-25 NOTE — ED Triage Notes (Signed)
Pt reports pelvic pain and abd pain since yesterday. Pt reports it is not time for her cycle but she has blood when she urinates. Pt denies vaginal discharge and reports has not been sexually active in months. Pt reports also has some lower abd pain. Pt reports pain is crampy in nature.

## 2019-11-25 NOTE — ED Notes (Signed)
Pt reports that she has been seeing blood in her urine x 2 days. Pt states that she is also having pain. Pt denies fevers but states that she has had hot and cold sweats.

## 2019-11-25 NOTE — ED Provider Notes (Signed)
[facility] Emergency Department Provider Note   ____________________________________________    I have reviewed the triage vital signs and the nursing notes.   HISTORY  Chief Complaint Pelvic Pain     HPI Tracie Meyer is a 29 y.o. female who presents with complaints of bilateral pelvic pain.  Patient describes right greater than left pelvic pain and cramping.  She reports she has noted some blood in the toilet with urination.  Denies pain with urination.  No fevers or chills.  No nausea or vomiting.  Has not take anything for this.  Reports last period was end of February, no period in March.  Does have 2 children.  Does not believe that she is pregnant.  Past Medical History:  Diagnosis Date  . History of fainting spells of unknown cause     Patient Active Problem List   Diagnosis Date Noted  . Labor and delivery, indication for care 04/17/2016  . Preterm contractions 04/14/2016  . Preterm labor in second trimester with preterm delivery in third trimester 04/08/2016  . Fall (on) (from) other stairs and steps, initial encounter 02/05/2016    Past Surgical History:  Procedure Laterality Date  . APPENDECTOMY  2013    Prior to Admission medications   Medication Sig Start Date End Date Taking? Authorizing Provider  amoxicillin (AMOXIL) 875 MG tablet Take 1 tablet (875 mg total) by mouth 2 (two) times daily. 08/26/18   Sherrie Mustache Roselyn Bering, PA-C  ferrous sulfate 325 (65 FE) MG tablet Take 1 tablet (325 mg total) by mouth 2 (two) times daily with a meal. 05/31/16   Brothers, Delaney Meigs, CNM  methylPREDNISolone (MEDROL DOSEPAK) 4 MG TBPK tablet Take 6 pills on day one then decrease by 1 pill each day 08/26/18   Faythe Ghee, PA-C     Allergies No known allergies  Family History  Problem Relation Age of Onset  . Cancer Maternal Grandmother        pancreatic  . Depression Maternal Grandfather     Social History Social History   Tobacco Use  .  Smoking status: Current Every Day Smoker    Packs/day: 0.50  . Smokeless tobacco: Never Used  Substance Use Topics  . Alcohol use: No    Comment: Social   . Drug use: No    Review of Systems  Constitutional: No fever/chills Eyes: No visual changes.  ENT: No sore throat. Cardiovascular: Denies chest pain. Respiratory: Denies shortness of breath. Gastrointestinal: As above Genitourinary: As above Musculoskeletal: Negative for back pain. Skin: Negative for rash. Neurological: Negative for headaches or weakness   ____________________________________________   PHYSICAL EXAM:  VITAL SIGNS: ED Triage Vitals  Enc Vitals Group     BP 11/25/19 0854 121/80     Pulse Rate 11/25/19 0854 72     Resp 11/25/19 0854 20     Temp 11/25/19 0854 98.8 F (37.1 C)     Temp Source 11/25/19 0854 Oral     SpO2 11/25/19 0854 100 %     Weight 11/25/19 0855 51.3 kg (113 lb)     Height 11/25/19 0855 1.702 m (5\' 7" )     Head Circumference --      Peak Flow --      Pain Score 11/25/19 0855 3     Pain Loc --      Pain Edu? --      Excl. in GC? --     Constitutional: Alert and oriented.   Nose:  No congestion/rhinnorhea. Mouth/Throat: Mucous membranes are moist.    Cardiovascular: Normal rate, regular rhythm. Kermit Balo peripheral circulation. Respiratory: Normal respiratory effort.  No retractions. Lungs CTAB. Gastrointestinal: Mild tenderness in the pelvis bilaterally. No distention.  No CVA tenderness. Genitourinary: deferred Musculoskeletal:   Warm and well perfused Neurologic:  Normal speech and language. No gross focal neurologic deficits are appreciated.  Skin:  Skin is warm, dry and intact. No rash noted. Psychiatric: Mood and affect are normal. Speech and behavior are normal.  ____________________________________________   LABS (all labs ordered are listed, but only abnormal results are displayed)  Labs Reviewed  URINALYSIS, COMPLETE (UACMP) WITH MICROSCOPIC - Abnormal; Notable  for the following components:      Result Value   Color, Urine YELLOW (*)    APPearance HAZY (*)    Hgb urine dipstick LARGE (*)    Leukocytes,Ua TRACE (*)    Bacteria, UA RARE (*)    All other components within normal limits  HCG, QUANTITATIVE, PREGNANCY - Abnormal; Notable for the following components:   hCG, Beta Chain, Quant, S 18,063 (*)    All other components within normal limits  POCT PREGNANCY, URINE - Abnormal; Notable for the following components:   Preg Test, Ur POSITIVE (*)    All other components within normal limits  BASIC METABOLIC PANEL  CBC  POC URINE PREG, ED  ABO/RH   ____________________________________________  EKG  None ____________________________________________  RADIOLOGY  Ultrasound OB ____________________________________________   PROCEDURES  Procedure(s) performed: No  Procedures   Critical Care performed: No ____________________________________________   INITIAL IMPRESSION / ASSESSMENT AND PLAN / ED COURSE  Pertinent labs & imaging results that were available during my care of the patient were reviewed by me and considered in my medical decision making (see chart for details).  Patient with bilateral pelvic pain, right greater than left.  Urine pregnancy test is positive.  Differential includes miscarriage, tubal pregnancy, urinary tract infection.  Will add beta hCG, ABO Rh, obtain ultrasound.  Tylenol for pain.  Patient is Rh+.  Subchorionic hemorrhage seen on ultrasound.  Likely gestational sac on ultrasound.  Discussed this with patient, discussed increased risk of miscarriage.  She will follow up with OB/GYN closely.  Return precautions discussed    ____________________________________________   FINAL CLINICAL IMPRESSION(S) / ED DIAGNOSES  Final diagnoses:  Less than [redacted] weeks gestation of pregnancy  Subchorionic hematoma in first trimester, single or unspecified fetus        Note:  This document was prepared using  Dragon voice recognition software and may include unintentional dictation errors.   Lavonia Drafts, MD 11/25/19 1252

## 2019-11-25 NOTE — ED Notes (Signed)
Pt reports "pulling" sensation in her lower abd, pt states that this started about 2 days ago.

## 2019-11-25 NOTE — ED Notes (Signed)
Called and spoke with lab, they will add ABO/RH on to purple top tube they have in lab. Also informed them of HCG add on.

## 2019-11-27 ENCOUNTER — Emergency Department
Admission: EM | Admit: 2019-11-27 | Discharge: 2019-11-27 | Disposition: A | Discharge status: Home / Self Care | Payer: Self-pay | Attending: Emergency Medicine | Admitting: Emergency Medicine

## 2019-11-27 ENCOUNTER — Other Ambulatory Visit: Payer: Self-pay

## 2019-11-27 ENCOUNTER — Encounter: Payer: Self-pay | Admitting: Emergency Medicine

## 2019-11-27 DIAGNOSIS — R102 Pelvic and perineal pain: Secondary | ICD-10-CM | POA: Insufficient documentation

## 2019-11-27 DIAGNOSIS — Z3A00 Weeks of gestation of pregnancy not specified: Secondary | ICD-10-CM | POA: Insufficient documentation

## 2019-11-27 DIAGNOSIS — Z79899 Other long term (current) drug therapy: Secondary | ICD-10-CM | POA: Insufficient documentation

## 2019-11-27 DIAGNOSIS — O039 Complete or unspecified spontaneous abortion without complication: Secondary | ICD-10-CM | POA: Insufficient documentation

## 2019-11-27 DIAGNOSIS — F17210 Nicotine dependence, cigarettes, uncomplicated: Secondary | ICD-10-CM | POA: Insufficient documentation

## 2019-11-27 LAB — BASIC METABOLIC PANEL
Anion gap: 5 (ref 5–15)
BUN: 17 mg/dL (ref 6–20)
CO2: 26 mmol/L (ref 22–32)
Calcium: 9.3 mg/dL (ref 8.9–10.3)
Chloride: 107 mmol/L (ref 98–111)
Creatinine, Ser: 0.62 mg/dL (ref 0.44–1.00)
GFR calc Af Amer: >60 mL/min (ref >60)
GFR calc non Af Amer: >60 mL/min (ref >60)
Glucose, Bld: 99 mg/dL (ref 70–99)
Potassium: 4 mmol/L (ref 3.5–5.1)
Sodium: 138 mmol/L (ref 135–145)

## 2019-11-27 LAB — CBC
HCT: 38.8 % (ref 36.0–46.0)
Hemoglobin: 12.8 g/dL (ref 12.0–15.0)
MCH: 30.5 pg (ref 26.0–34.0)
MCHC: 33 g/dL (ref 30.0–36.0)
MCV: 92.6 fL (ref 80.0–100.0)
Platelets: 174 10*3/uL (ref 150–400)
RBC: 4.19 MIL/uL (ref 3.87–5.11)
RDW: 12.4 % (ref 11.5–15.5)
WBC: 12.5 10*3/uL — ABNORMAL HIGH (ref 4.0–10.5)
nRBC: 0 % (ref 0.0–0.2)

## 2019-11-27 LAB — HCG, QUANTITATIVE, PREGNANCY: hCG, Beta Chain, Quant, S: 14820 m[IU]/mL — ABNORMAL HIGH (ref <5)

## 2019-11-27 MED ORDER — DIPHENHYDRAMINE HCL 50 MG/ML IJ SOLN
12.5000 mg | Freq: Once | INTRAMUSCULAR | Status: AC
  Administered 2019-11-27: 12.5 mg via INTRAVENOUS
  Filled 2019-11-27: qty 1

## 2019-11-27 MED ORDER — ONDANSETRON HCL 4 MG/2ML IJ SOLN
4.0000 mg | Freq: Once | INTRAMUSCULAR | Status: AC
  Administered 2019-11-27: 10:00:00 4 mg via INTRAVENOUS
  Filled 2019-11-27: qty 2

## 2019-11-27 MED ORDER — MORPHINE SULFATE (PF) 4 MG/ML IV SOLN
4.0000 mg | Freq: Once | INTRAVENOUS | Status: AC
  Administered 2019-11-27: 4 mg via INTRAVENOUS
  Filled 2019-11-27: qty 1

## 2019-11-27 MED ORDER — HYDROCODONE-ACETAMINOPHEN 5-325 MG PO TABS: 1.0000 | ORAL_TABLET | ORAL | 0 refills | Status: DC | PRN

## 2019-11-27 NOTE — ED Triage Notes (Signed)
Pt here for pelvic pain and vaginal bleeding. Here Friday for same but pain and bleeding worse.  Passing clots.  G3P2

## 2019-11-27 NOTE — ED Provider Notes (Signed)
[facility] Emergency Department Provider Note  Time seen: 8:39 AM  I have reviewed the triage vital signs and the nursing notes.   HISTORY  Chief Complaint Abdominal Pain   HPI Tracie Meyer is a 29 y.o. female G3, P2 presents to the emergency department for vaginal bleeding and significant lower abdominal pain/cramping.  Patient was seen in the emergency department 2 days ago 11/25/2019 for vaginal bleeding and lower abdominal cramping.  Patient had an ultrasound showing likely gestational sac.  Patient was told of the possibility that she could be having a miscarriage.  She states since going home she had minimal bleeding until last night when she began having heavy bleeding with fairly significant lower abdominal pain/cramping.  Currently patient is in mild distress due to abdominal pain she is also tearful as she is scared she could be going through a miscarriage.  No history of miscarriage previously.   Past Medical History:  Diagnosis Date  . History of fainting spells of unknown cause     Patient Active Problem List   Diagnosis Date Noted  . Labor and delivery, indication for care 04/17/2016  . Preterm contractions 04/14/2016  . Preterm labor in second trimester with preterm delivery in third trimester 04/08/2016  . Fall (on) (from) other stairs and steps, initial encounter 02/05/2016    Past Surgical History:  Procedure Laterality Date  . APPENDECTOMY  2013    Prior to Admission medications   Medication Sig Start Date End Date Taking? Authorizing Provider  amoxicillin (AMOXIL) 875 MG tablet Take 1 tablet (875 mg total) by mouth 2 (two) times daily. 08/26/18   Sherrie Mustache Roselyn Bering, PA-C  ferrous sulfate 325 (65 FE) MG tablet Take 1 tablet (325 mg total) by mouth 2 (two) times daily with a meal. 05/31/16   Brothers, Delaney Meigs, CNM  methylPREDNISolone (MEDROL DOSEPAK) 4 MG TBPK tablet Take 6 pills on day one then decrease by 1 pill each day 08/26/18   Faythe Ghee, PA-C    Allergies  Allergen Reactions  . No Known Allergies Other (See Comments)    Family History  Problem Relation Age of Onset  . Cancer Maternal Grandmother        pancreatic  . Depression Maternal Grandfather     Social History Social History   Tobacco Use  . Smoking status: Current Every Day Smoker    Packs/day: 0.50  . Smokeless tobacco: Never Used  Substance Use Topics  . Alcohol use: No    Comment: Social   . Drug use: No    Review of Systems Constitutional: Negative for fever. Cardiovascular: Negative for chest pain. Respiratory: Negative for shortness of breath. Gastrointestinal: Moderate lower abdominal cramping. Genitourinary: Positive for vaginal bleeding with clot Musculoskeletal: Negative for musculoskeletal complaints Neurological: Negative for headache All other ROS negative  ____________________________________________   PHYSICAL EXAM:  VITAL SIGNS: ED Triage Vitals  Enc Vitals Group     BP 11/27/19 0735 128/71     Pulse Rate 11/27/19 0735 91     Resp 11/27/19 0735 20     Temp 11/27/19 0735 98.8 F (37.1 C)     Temp Source 11/27/19 0735 Oral     SpO2 11/27/19 0735 98 %     Weight 11/27/19 0729 113 lb (51.3 kg)     Height 11/27/19 0729 5\' 7"  (1.702 m)     Head Circumference --      Peak Flow --      Pain Score 11/27/19  0729 10     Pain Loc --      Pain Edu? --      Excl. in [place]? --    Constitutional: Alert and oriented. Well appearing and in no distress. Eyes: Normal exam ENT      Head: Normocephalic and atraumatic.      Mouth/Throat: Mucous membranes are moist. Cardiovascular: Normal rate, regular rhythm.  Respiratory: Normal respiratory effort without tachypnea nor retractions. Breath sounds are clear  Gastrointestinal: Soft, moderate lower abdominal tenderness to palpation.  Somewhat worse in left lower quadrant. Musculoskeletal: Nontender with normal range of motion in all extremities.  Neurologic:  Normal speech and  language. No gross focal neurologic deficits  Skin:  Skin is warm, dry and intact.  Psychiatric: Mood and affect are normal.   ____________________________________________    INITIAL IMPRESSION / ASSESSMENT AND PLAN / ED COURSE  Pertinent labs & imaging results that were available during my care of the patient were reviewed by me and considered in my medical decision making (see chart for details).   Patient presents to the emergency department for lower abdominal pain cramping and increased vaginal bleeding since last night.  Differential would include miscarriage, subchorionic hemorrhage, vaginal bleeding during pregnancy.  Less likely to be ectopic given fairly reassuring ultrasound performed 48 hours ago.  We will repeat labs including beta hCG.  Patient does not wish for any pain medication unless we can confirm that it is a miscarriage based off blood work.  We will continue to closely monitor while awaiting lab results.  hCG is declining down to 14,000 now.  I performed a pelvic exam there is a small amount of tissue in the cervical os that was removed with ring forceps.  No significant bleeding at this time.  Patient did have a slight reaction to morphine she had some localized inflammation of her vein in the antecubital fossa.  Dose a small amount of Benadryl 12.5 mg IV.  Patient states she has had pain medication in the past without response.  We will likely discharge home with a small amount of pain medication, ibuprofen and have the patient follow-up with her doctor in 3 to 5 days for recheck.  Patient agreeable.  Discussed return precautions.   Tracie Meyer was evaluated in Emergency Department on 11/27/2019 for the symptoms described in the history of present illness. She was evaluated in the context of the global COVID-19 pandemic, which necessitated consideration that the patient might be at risk for infection with the SARS-CoV-2 virus that causes COVID-19. Institutional protocols  and algorithms that pertain to the evaluation of patients at risk for COVID-19 are in a state of rapid change based on information released by regulatory bodies including the CDC and federal and state organizations. These policies and algorithms were followed during the patient's care in the ED.  ____________________________________________   FINAL CLINICAL IMPRESSION(S) / ED DIAGNOSES  Lower abdominal pain Vaginal bleeding Miscarriage   Harvest Dark, MD 11/27/19 1037

## 2019-11-27 NOTE — ED Notes (Signed)
Pt states approx [redacted] weeks pregnant. Pt states bleeding since Wed and was seen Friday and release. Pt states bleeding has increased and now having abdominal cramping/Pain.

## 2019-11-27 NOTE — Discharge Instructions (Signed)
Please follow-up with your doctor in 3 to 4 days for recheck/reevaluation.  Return to the emergency department for any significant increase in bleeding, significant increase in pain, or any other symptom personally concerning to yourself.

## 2019-11-27 NOTE — ED Triage Notes (Signed)
offered pt tylenol for pain, she has already taken for this morning.

## 2019-12-26 ENCOUNTER — Other Ambulatory Visit: Payer: Self-pay

## 2019-12-26 ENCOUNTER — Emergency Department
Admission: EM | Admit: 2019-12-26 | Discharge: 2019-12-26 | Disposition: A | Discharge status: Home / Self Care | Payer: Self-pay | Attending: Emergency Medicine | Admitting: Emergency Medicine

## 2019-12-26 ENCOUNTER — Emergency Department: Payer: Self-pay

## 2019-12-26 DIAGNOSIS — O26891 Other specified pregnancy related conditions, first trimester: Secondary | ICD-10-CM | POA: Insufficient documentation

## 2019-12-26 DIAGNOSIS — Z79899 Other long term (current) drug therapy: Secondary | ICD-10-CM | POA: Insufficient documentation

## 2019-12-26 DIAGNOSIS — F17200 Nicotine dependence, unspecified, uncomplicated: Secondary | ICD-10-CM | POA: Insufficient documentation

## 2019-12-26 DIAGNOSIS — Z3A00 Weeks of gestation of pregnancy not specified: Secondary | ICD-10-CM | POA: Insufficient documentation

## 2019-12-26 DIAGNOSIS — O039 Complete or unspecified spontaneous abortion without complication: Secondary | ICD-10-CM | POA: Insufficient documentation

## 2019-12-26 DIAGNOSIS — N939 Abnormal uterine and vaginal bleeding, unspecified: Secondary | ICD-10-CM

## 2019-12-26 DIAGNOSIS — R102 Pelvic and perineal pain: Secondary | ICD-10-CM | POA: Insufficient documentation

## 2019-12-26 LAB — BASIC METABOLIC PANEL
Anion gap: 7 (ref 5–15)
BUN: 15 mg/dL (ref 6–20)
CO2: 26 mmol/L (ref 22–32)
Calcium: 8.9 mg/dL (ref 8.9–10.3)
Chloride: 107 mmol/L (ref 98–111)
Creatinine, Ser: 0.7 mg/dL (ref 0.44–1.00)
GFR calc Af Amer: >60 mL/min (ref >60)
GFR calc non Af Amer: >60 mL/min (ref >60)
Glucose, Bld: 108 mg/dL — ABNORMAL HIGH (ref 70–99)
Potassium: 4 mmol/L (ref 3.5–5.1)
Sodium: 140 mmol/L (ref 135–145)

## 2019-12-26 LAB — URINALYSIS, COMPLETE (UACMP) WITH MICROSCOPIC
Bacteria, UA: NONE SEEN
RBC / HPF: >50 RBC/hpf — ABNORMAL HIGH (ref 0–5)
Specific Gravity, Urine: 1.02 (ref 1.005–1.030)

## 2019-12-26 LAB — CBC
HCT: 34 % — ABNORMAL LOW (ref 36.0–46.0)
Hemoglobin: 11.1 g/dL — ABNORMAL LOW (ref 12.0–15.0)
MCH: 30.7 pg (ref 26.0–34.0)
MCHC: 32.6 g/dL (ref 30.0–36.0)
MCV: 93.9 fL (ref 80.0–100.0)
Platelets: 129 10*3/uL — ABNORMAL LOW (ref 150–400)
RBC: 3.62 MIL/uL — ABNORMAL LOW (ref 3.87–5.11)
RDW: 12.3 % (ref 11.5–15.5)
WBC: 6.7 10*3/uL (ref 4.0–10.5)
nRBC: 0 % (ref 0.0–0.2)

## 2019-12-26 LAB — WET PREP, GENITAL
Clue Cells Wet Prep HPF POC: NONE SEEN
Sperm: NONE SEEN
Trich, Wet Prep: NONE SEEN
Yeast Wet Prep HPF POC: NONE SEEN

## 2019-12-26 LAB — HCG, QUANTITATIVE, PREGNANCY: hCG, Beta Chain, Quant, S: 4 m[IU]/mL (ref <5)

## 2019-12-26 LAB — CHLAMYDIA/NGC RT PCR (ARMC ONLY)
Chlamydia Tr: NOT DETECTED
N gonorrhoeae: NOT DETECTED

## 2019-12-26 NOTE — ED Notes (Signed)
See triage note  Presents with some vaginal bleeding  States she was seen in April and had a miscarriage   conts to have heavy bleeding

## 2019-12-26 NOTE — Discharge Instructions (Signed)
You have an appointment with Dr. Valentino Saxon tomorrow at 4:15

## 2019-12-26 NOTE — ED Provider Notes (Signed)
[facility] Emergency Department Provider Note  ____________________________________________   First MD Initiated Contact with Patient 12/26/19 1226     (approximate)  I have reviewed the triage vital signs and the nursing notes.   HISTORY  Chief Complaint Vaginal Bleeding   HPI Tracie Meyer is a 29 y.o. female presents to the ED with complaint of vaginal bleeding.  Patient states that she was seen in the ED on April 16 at which time she was told that she was having a miscarriage.  Patient states she has continued to have heavy bleeding since that time and reports that she is going through a pad each hour.  She reports that she has been trying to call Dr. Logan Bores for her follow-up and "gets a busy signal".  Patient denies any fever, chills, nausea or vomiting.  She rates her discomfort as 6 out of 10.      Past Medical History:  Diagnosis Date  . History of fainting spells of unknown cause     Patient Active Problem List   Diagnosis Date Noted  . Labor and delivery, indication for care 04/17/2016  . Preterm contractions 04/14/2016  . Preterm labor in second trimester with preterm delivery in third trimester 04/08/2016  . Fall (on) (from) other stairs and steps, initial encounter 02/05/2016    Past Surgical History:  Procedure Laterality Date  . APPENDECTOMY  2013    Prior to Admission medications   Medication Sig Start Date End Date Taking? Authorizing Provider  ferrous sulfate 325 (65 FE) MG tablet Take 1 tablet (325 mg total) by mouth 2 (two) times daily with a meal. 05/31/16   Brothers, Delaney Meigs, CNM    Allergies Morphine and related and No known allergies  Family History  Problem Relation Age of Onset  . Cancer Maternal Grandmother        pancreatic  . Depression Maternal Grandfather     Social History Social History   Tobacco Use  . Smoking status: Current Every Day Smoker    Packs/day: 0.50  . Smokeless tobacco: Never Used    Substance Use Topics  . Alcohol use: No    Comment: Social   . Drug use: No    Review of Systems Constitutional: No fever/chills Cardiovascular: Denies chest pain. Respiratory: Denies shortness of breath. Gastrointestinal: No abdominal pain.  No nausea, no vomiting.   Genitourinary: Negative for dysuria.  Positive for vaginal bleeding.  Positive recent spontaneous abortion. Musculoskeletal: Negative for muscle aches. Skin: Negative for rash. Neurological: Negative for headaches, focal weakness or numbness. ____________________________________________   PHYSICAL EXAM:  VITAL SIGNS: ED Triage Vitals  Enc Vitals Group     BP 12/26/19 0941 120/68     Pulse Rate 12/26/19 0941 65     Resp 12/26/19 0937 16     Temp 12/26/19 0941 98.4 F (36.9 C)     Temp Source 12/26/19 0937 Oral     SpO2 12/26/19 0941 99 %     Weight 12/26/19 0938 115 lb (52.2 kg)     Height 12/26/19 0938 5\' 7"  (1.702 m)     Head Circumference --      Peak Flow --      Pain Score 12/26/19 0938 6     Pain Loc --      Pain Edu? --      Excl. in [place]? --     Constitutional: Alert and oriented. Well appearing and in no acute distress. Eyes: Conjunctivae are normal.  Head: Atraumatic. Neck: No stridor.   Cardiovascular: Normal rate, regular rhythm. Grossly normal heart sounds.  Good peripheral circulation. Respiratory: Normal respiratory effort.  No retractions. Lungs CTAB. Gastrointestinal: Soft and nontender. No distention. Genitourinary: On pelvic exam there is dark blood in the vaginal vault.  Patient is nontender right adnexal area.  Minimal discomfort is noted with cervical motion and on the left adnexal area. Musculoskeletal: Moves upper and lower extremities any difficulty and normal gait was noted. Neurologic:  Normal speech and language. No gross focal neurologic deficits are appreciated. No gait instability. Skin:  Skin is warm, dry and intact. No rash noted. Psychiatric: Mood and affect are normal.  Speech and behavior are normal.  ____________________________________________   LABS (all labs ordered are listed, but only abnormal results are displayed)  Labs Reviewed  WET PREP, GENITAL - Abnormal; Notable for the following components:      Result Value   WBC, Wet Prep HPF POC RARE (*)    All other components within normal limits  BASIC METABOLIC PANEL - Abnormal; Notable for the following components:   Glucose, Bld 108 (*)    All other components within normal limits  CBC - Abnormal; Notable for the following components:   RBC 3.62 (*)    Hemoglobin 11.1 (*)    HCT 34.0 (*)    Platelets 129 (*)    All other components within normal limits  URINALYSIS, COMPLETE (UACMP) WITH MICROSCOPIC - Abnormal; Notable for the following components:   Color, Urine RED (*)    APPearance TURBID (*)    Glucose, UA   (*)    Value: TEST NOT REPORTED DUE TO COLOR INTERFERENCE OF URINE PIGMENT   Hgb urine dipstick   (*)    Value: TEST NOT REPORTED DUE TO COLOR INTERFERENCE OF URINE PIGMENT   Bilirubin Urine   (*)    Value: TEST NOT REPORTED DUE TO COLOR INTERFERENCE OF URINE PIGMENT   Ketones, ur   (*)    Value: TEST NOT REPORTED DUE TO COLOR INTERFERENCE OF URINE PIGMENT   Protein, ur   (*)    Value: TEST NOT REPORTED DUE TO COLOR INTERFERENCE OF URINE PIGMENT   Nitrite   (*)    Value: TEST NOT REPORTED DUE TO COLOR INTERFERENCE OF URINE PIGMENT   Leukocytes,Ua   (*)    Value: TEST NOT REPORTED DUE TO COLOR INTERFERENCE OF URINE PIGMENT   RBC / HPF >50 (*)    All other components within normal limits  CHLAMYDIA/NGC RT PCR ([facility] ONLY)  HCG, QUANTITATIVE, PREGNANCY    RADIOLOGY   Official radiology report(s): US OB LESS THAN 14 WEEKS W/ OB TRANSVAGINAL AND DOPPLER  Result Date: 12/26/2019 CLINICAL DATA:  Vaginal bleeding, pelvic pain. Recent dilation and curettage 11/25/2019 EXAM: OBSTETRIC <14 WK [place] AND TRANSVAGINAL OB US DOPPLER ULTRASOUND OF OVARIES TECHNIQUE: Both  transabdominal and transvaginal ultrasound examinations were performed for complete evaluation of the gestation as well as the maternal uterus, adnexal regions, and pelvic cul-de-sac. Transvaginal technique was performed to assess early pregnancy. Color and duplex Doppler ultrasound was utilized to evaluate blood flow to the ovaries. COMPARISON:  11/25/2019 FINDINGS: Intrauterine gestational sac: None Yolk sac:  Not Visualized. Embryo:  Not Visualized. Cardiac Activity: Not Visualized. Maternal uterus/adnexae: Anteverted uterus measuring 8.9 x 4.4 x 5.3 cm, volume 109 mL. Uterus appears unremarkable without fibroid or uterine mass. Endometrial thickness of 4 mm, within normal limits. No gestational sac or debris within the endometrial canal. The right  ovary measures 3.7 x 3.4 x 3.8 cm, 24 mL. Right ovarian cyst measuring 3.4 cm. Right ovary appears otherwise unremarkable. The left ovary measures 2.0 x 1.6 x 1.4 cm, volume 2 mL. Pulsed Doppler evaluation of both ovaries demonstrates normal appearing low-resistance arterial and venous waveforms. No free fluid within the pelvis. IMPRESSION: 1. Unremarkable sonographic appearance of the pelvis. Previously seen gestational sac is no longer identified. There is no evidence of intrauterine pregnancy or retained products of conception. 2. Negative for adnexal torsion. 3. No free fluid within the pelvis. Electronically Signed   By: Davina Poke D.O.   On: 12/26/2019 15:40    ____________________________________________   PROCEDURES  Procedure(s) performed (including Critical Care):  Procedures   ____________________________________________   INITIAL IMPRESSION / ASSESSMENT AND PLAN / ED COURSE  As part of my medical decision making, I reviewed the following data within the electronic MEDICAL RECORD NUMBER Notes from prior ED visits and [place] Controlled Substance Database  29 year old female presents to the ED with complaint of vaginal bleeding since she was  seen in the ED on 11/27/2019.  At that time she was told that she most likely was having a miscarriage.  Patient apparently misunderstood because she believed that and D&C was performed in the ED.  Patient denies any dizziness, shortness of breath, fever or chills.  hCG is 4 which is greatly reduced from last month at 1400.  Hemoglobin and hematocrit are 11.1 and 34.0 respectively.  Wet prep was negative.  GC and Chlamydia were negative.  Transvaginal ultrasound was negative for any retained products of conception.  There is a right ovarian cyst present.  No free fluid was noted.  No prescriptions were written as patient has an appointment with Dr. Marcelline Mates at 4:15 pm on 12/27/2019 which is tomorrow.  Patient was made aware and states that she can make this appointment.  ____________________________________________   FINAL CLINICAL IMPRESSION(S) / ED DIAGNOSES  Final diagnoses:  Abnormal vaginal bleeding  Spontaneous abortion     ED Discharge Orders    None       Note:  This document was prepared using Dragon voice recognition software and may include unintentional dictation errors.    Johnn Hai, PA-C 12/26/19 1629    Harvest Dark, MD 12/26/19 2134

## 2019-12-26 NOTE — ED Triage Notes (Signed)
Pt states she had a miscarriage around 4/16 and had to have a D&C and states she has been having vaginal bleeding but it has worsened and now changing pads <1hr. States she has been trying to call Dr. Logan Bores for her follow up and keeps getting a busy signal, why I called the number it went straight threw. Pt is in NAD at present,.

## 2019-12-27 ENCOUNTER — Encounter: Payer: Self-pay | Admitting: Obstetrics and Gynecology

## 2019-12-28 ENCOUNTER — Other Ambulatory Visit: Payer: Self-pay

## 2019-12-28 ENCOUNTER — Ambulatory Visit (INDEPENDENT_AMBULATORY_CARE_PROVIDER_SITE_OTHER): Payer: Self-pay | Admitting: Obstetrics and Gynecology

## 2019-12-28 ENCOUNTER — Encounter: Payer: Self-pay | Admitting: Obstetrics and Gynecology

## 2019-12-28 VITALS — BP 105/51 | HR 57 | Ht 67.0 in | Wt 111.3 lb

## 2019-12-28 DIAGNOSIS — O039 Complete or unspecified spontaneous abortion without complication: Secondary | ICD-10-CM

## 2019-12-28 DIAGNOSIS — N939 Abnormal uterine and vaginal bleeding, unspecified: Secondary | ICD-10-CM

## 2019-12-28 NOTE — Progress Notes (Signed)
Pt present for ED follow up. Pt stated that her LMP was the at the end of the February. Pt stated bleeding since April and possible miscarriage.

## 2019-12-28 NOTE — Patient Instructions (Signed)
Abnormal Uterine Bleeding °Abnormal uterine bleeding means bleeding more than usual from your uterus. It can include: °· Bleeding between periods. °· Bleeding after sex. °· Bleeding that is heavier than normal. °· Periods that last longer than usual. °· Bleeding after you have stopped having your period (menopause). °There are many problems that may cause this. You should see a doctor for any kind of bleeding that is not normal. Treatment depends on the cause of the bleeding. °Follow these instructions at home: °· Watch your condition for any changes. °· Do not use tampons, douche, or have sex, if your doctor tells you not to. °· Change your pads often. °· Get regular well-woman exams. Make sure they include a pelvic exam and cervical cancer screening. °· Keep all follow-up visits as told by your doctor. This is important. °Contact a doctor if: °· The bleeding lasts more than one week. °· You feel dizzy at times. °· You feel like you are going to throw up (nauseous). °· You throw up. °Get help right away if: °· You pass out. °· You have to change pads every hour. °· You have belly (abdominal) pain. °· You have a fever. °· You get sweaty. °· You get weak. °· You passing large blood clots from your vagina. °Summary °· Abnormal uterine bleeding means bleeding more than usual from your uterus. °· There are many problems that may cause this. You should see a doctor for any kind of bleeding that is not normal. °· Treatment depends on the cause of the bleeding. °This information is not intended to replace advice given to you by your health care provider. Make sure you discuss any questions you have with your health care provider. °Document Revised: 07/22/2016 Document Reviewed: 07/22/2016 °Elsevier Patient Education © 2020 [employer] ° °

## 2019-12-28 NOTE — Progress Notes (Signed)
GYNECOLOGY CLINIC PROGRESS NOTE Subjective:     Tracie Meyer is an 29 y.o. [phone] woman who presents for abnormal bleeding.  She was seen in April (11/25/2019) for an irregular cycle in the Emergency Room, and was discovered to be ~ [redacted] weeks pregnant. She was also diagnosed with a subchorionic hemorrhage.  She states that 2 days later, she returned to the Emergency Room due to heavier bleeding and cramping, and was diagnosed with miscarriage. Reports that a D&C was performed in the Emergency Room. Since that time she has continued to have bleeding (sometimesheavy).  She was seen again in the Emergency Room 2 days ago for the bleeding.  Labs and ultrasound showed that she was no longer pregnant, with no retained products of conception.  Patient just desires to know what is going on, and does not feel normal.     Gynecologic History: Last pap smear: ~ 4 years ago with her last pregnancy.  History of STI's: None No LMP recorded (lmp unknown).  Last cycle was sometime in February.      Past Medical History:  Diagnosis Date  . History of fainting spells of unknown cause     Family History  Problem Relation Age of Onset  . Healthy Mother   . Testicular cancer Father   . Cancer Maternal Grandmother        pancreatic  . Depression Maternal Grandfather     Social History   Socioeconomic History  . Marital status: Single    Spouse name: Not on file  . Number of children: Not on file  . Years of education: Not on file  . Highest education level: Not on file  Occupational History  . Not on file  Tobacco Use  . Smoking status: Former Smoker    Packs/day: 0.50  . Smokeless tobacco: Never Used  Substance and Sexual Activity  . Alcohol use: No    Comment: Social   . Drug use: No  . Sexual activity: Not Currently    Birth control/protection: None  Other Topics Concern  . Not on file  Social History Narrative  . Not on file   Social Determinants of Health   Financial  Resource Strain:   . Difficulty of Paying Living Expenses:   Food Insecurity:   . Worried About Charity fundraiser in the Last Year:   . Arboriculturist in the Last Year:   Transportation Needs:   . Film/video editor (Medical):   Marland Kitchen Lack of Transportation (Non-Medical):   Physical Activity:   . Days of Exercise per Week:   . Minutes of Exercise per Session:   Stress:   . Feeling of Stress :   Social Connections:   . Frequency of Communication with Friends and Family:   . Frequency of Social Gatherings with Friends and Family:   . Attends Religious Services:   . Active Member of Clubs or Organizations:   . Attends Archivist Meetings:   Marland Kitchen Marital Status:   Intimate Partner Violence:   . Fear of Current or Ex-Partner:   . Emotionally Abused:   Marland Kitchen Physically Abused:   . Sexually Abused:     Current Outpatient Medications on File Prior to Visit  Medication Sig Dispense Refill  . ferrous sulfate 325 (65 FE) MG tablet Take 1 tablet (325 mg total) by mouth 2 (two) times daily with a meal. 30 tablet 3   No current facility-administered medications on file prior to visit.  Allergies  Allergen Reactions  . Morphine And Related     Immediate vein hypersensitivity, swelling, redness and rash.   . No Known Allergies Other (See Comments)     Review of Systems Constitutional: negative for chills, fatigue, fevers and sweats Eyes: negative for irritation, redness and visual disturbance Ears, nose, mouth, throat, and face: negative for hearing loss, nasal congestion, snoring and tinnitus Respiratory: negative for asthma, cough, sputum Cardiovascular: negative for chest pain, dyspnea, exertional chest pressure/discomfort, irregular heart beat, palpitations and syncope Gastrointestinal: negative for abdominal pain, change in bowel habits, nausea and vomiting Genitourinary: Positive for abnormal bleeding.  Negative for genital lesions, sexual problems and vaginal  discharge, dysuria and urinary incontinence Integument/breast: negative for breast lump, breast tenderness and nipple discharge Hematologic/lymphatic: negative for bleeding and easy bruising Musculoskeletal:negative for back pain and muscle weakness Neurological: negative for dizziness, headaches, vertigo and weakness Endocrine: negative for diabetic symptoms including polydipsia, polyuria and skin dryness Allergic/Immunologic: negative for hay fever and urticaria     Objective:    BP (!) 105/51   Pulse (!) 57   Ht 5\' 7"  (1.702 m)   Wt 111 lb 4.8 oz (50.5 kg)   LMP  (LMP Unknown)   BMI 17.43 kg/m   BP (!) 105/51   Pulse (!) 57   Ht 5\' 7"  (1.702 m)   Wt 111 lb 4.8 oz (50.5 kg)   LMP  (LMP Unknown)   BMI 17.43 kg/m  General appearance: alert and no distress Lungs: clear to auscultation bilaterally Heart: regular rate and rhythm, S1, S2 normal, no murmur, click, rub or gallop Abdomen: soft, non-tender; bowel sounds normal; no masses,  no organomegaly Pelvic: deferred (patient recently had exam in ER 2 days ago).  Extremities: extremities normal, atraumatic, no cyanosis or edema Neurologic: Grossly normal   Labs:  Results for orders placed or performed during the hospital encounter of 12/26/19  Wet prep, genital  Result Value Ref Range   Yeast Wet Prep HPF POC NONE SEEN NONE SEEN   Trich, Wet Prep NONE SEEN NONE SEEN   Clue Cells Wet Prep HPF POC NONE SEEN NONE SEEN   WBC, Wet Prep HPF POC RARE (A) NONE SEEN   Sperm NONE SEEN   Chlamydia/NGC rt PCR ([facility] only)   Specimen: Cervical/Vaginal swab  Result Value Ref Range   Specimen source GC/Chlam ENDOCERVICAL    Chlamydia Tr NOT DETECTED NOT DETECTED   N gonorrhoeae NOT DETECTED NOT DETECTED  Basic metabolic panel  Result Value Ref Range   Sodium 140 135 - 145 mmol/L   Potassium 4.0 3.5 - 5.1 mmol/L   Chloride 107 98 - 111 mmol/L   CO2 26 22 - 32 mmol/L   Glucose, Bld 108 (H) 70 - 99 mg/dL   BUN 15 6 - 20 mg/dL    Creatinine, Ser 0.44 - 1.00 mg/dL   Calcium 8.9 8.9 - 12/28/19 mg/dL   GFR calc non Af Amer >60 >60 mL/min   GFR calc Af Amer >60 >60 mL/min   Anion gap 7 5 - 15  CBC  Result Value Ref Range   WBC 6.7 4.0 - 10.5 K/uL   RBC 3.62 (L) 3.87 - 5.11 MIL/uL   Hemoglobin 11.1 (L) 12.0 - 15.0 g/dL   HCT 1.32 (L) 44.0 - 10.2 %   MCV 93.9 80.0 - 100.0 fL   MCH 30.7 26.0 - 34.0 pg   MCHC 32.6 30.0 - 36.0 g/dL   RDW 72.5 36.6 - 44.0 %  Platelets 129 (L) 150 - 400 K/uL   nRBC 0.0 0.0 - 0.2 %  hCG, quantitative, pregnancy  Result Value Ref Range   hCG, Beta Chain, Quant, S 4 <5 mIU/mL  Urinalysis, Complete w Microscopic  Result Value Ref Range   Color, Urine RED (A) YELLOW   APPearance TURBID (A) CLEAR   Specific Gravity, Urine 1.020 1.005 - 1.030   pH  5.0 - 8.0    TEST NOT REPORTED DUE TO COLOR INTERFERENCE OF URINE PIGMENT   Glucose, UA (A) NEGATIVE mg/dL    TEST NOT REPORTED DUE TO COLOR INTERFERENCE OF URINE PIGMENT   Hgb urine dipstick (A) NEGATIVE    TEST NOT REPORTED DUE TO COLOR INTERFERENCE OF URINE PIGMENT   Bilirubin Urine (A) NEGATIVE    TEST NOT REPORTED DUE TO COLOR INTERFERENCE OF URINE PIGMENT   Ketones, ur (A) NEGATIVE mg/dL    TEST NOT REPORTED DUE TO COLOR INTERFERENCE OF URINE PIGMENT   Protein, ur (A) NEGATIVE mg/dL    TEST NOT REPORTED DUE TO COLOR INTERFERENCE OF URINE PIGMENT   Nitrite (A) NEGATIVE    TEST NOT REPORTED DUE TO COLOR INTERFERENCE OF URINE PIGMENT   Leukocytes,Ua (A) NEGATIVE    TEST NOT REPORTED DUE TO COLOR INTERFERENCE OF URINE PIGMENT   RBC / HPF >50 (H) 0 - 5 RBC/hpf   WBC, UA 6-10 0 - 5 WBC/hpf   Bacteria, UA NONE SEEN NONE SEEN   Squamous Epithelial / LPF 6-10 0 - 5     Imaging:  US OB LESS THAN 14 WEEKS W/ OB TRANSVAGINAL AND DOPPLER CLINICAL DATA:  Vaginal bleeding, pelvic pain. Recent dilation and curettage 11/25/2019  EXAM: OBSTETRIC <14 WK [place] AND TRANSVAGINAL OB  US DOPPLER ULTRASOUND OF OVARIES  TECHNIQUE: Both  transabdominal and transvaginal ultrasound examinations were performed for complete evaluation of the gestation as well as the maternal uterus, adnexal regions, and pelvic cul-de-sac. Transvaginal technique was performed to assess early pregnancy.  Color and duplex Doppler ultrasound was utilized to evaluate blood flow to the ovaries.  COMPARISON:  11/25/2019  FINDINGS: Intrauterine gestational sac: None  Yolk sac:  Not Visualized.  Embryo:  Not Visualized.  Cardiac Activity: Not Visualized.  Maternal uterus/adnexae: Anteverted uterus measuring 8.9 x 4.4 x 5.3 cm, volume 109 mL. Uterus appears unremarkable without fibroid or uterine mass. Endometrial thickness of 4 mm, within normal limits. No gestational sac or debris within the endometrial canal.  The right ovary measures 3.7 x 3.4 x 3.8 cm, 24 mL. Right ovarian cyst measuring 3.4 cm. Right ovary appears otherwise unremarkable.  The left ovary measures 2.0 x 1.6 x 1.4 cm, volume 2 mL.  Pulsed Doppler evaluation of both ovaries demonstrates normal appearing low-resistance arterial and venous waveforms.  No free fluid within the pelvis.  IMPRESSION: 1. Unremarkable sonographic appearance of the pelvis. Previously seen gestational sac is no longer identified. There is no evidence of intrauterine pregnancy or retained products of conception. 2. Negative for adnexal torsion. 3. No free fluid within the pelvis.  Electronically Signed   By: Duanne Guess D.O.   On: 12/26/2019 15:40   Assessment:    Persistent abnormal bleeding after miscarriage    Plan:   1. Based on labs and ultrasound recently performed in the Emergency Room, patient's pregnancy hormone levels have returned to non-pregnant state, and no evidence of retained POC's present on ultrasound or other structural defects.  Discussed with patient that sometimes hormonal fluctuations continue after a miscarriage, which  can lead to persistent abnormal  bleeding.  Will check remaining hormones (TSH, FSH, LH, progesterone, and estradiol).  Discussed that she likely needs a rebalancing of her hormones. Offered trial of OCPs for 2-3 months to get abnormal bleeding to stop and to resume regular menses. She is ok with this plan. Advised on Sunday start. Discussed importance of taking pill at the same time every day, and can take at night before bedtime if nausea or other mild side effects occur. Can discontinue after 2-3 months if pregnancy desired again, or can decide on method of contraception desired if no plans for conception are ongoing at this time.  2. Discussed miscarriage in more detail, including low risk to future pregnancies, odds of recurrence, causes of miscarriages, etc. 2. RTC in 2-3 months.    Hildred Laser, MD [facility]

## 2019-12-29 LAB — PROGESTERONE: Progesterone: 0.2 ng/mL

## 2019-12-29 LAB — TSH: TSH: 0.895 u[IU]/mL (ref 0.450–4.500)

## 2019-12-29 LAB — FSH/LH
FSH: 6.8 m[IU]/mL
LH: 2 m[IU]/mL

## 2019-12-29 LAB — ESTRADIOL: Estradiol: 24.8 pg/mL

## 2020-03-29 ENCOUNTER — Encounter: Payer: Self-pay | Admitting: Obstetrics and Gynecology

## 2020-03-29 NOTE — Progress Notes (Deleted)
Pt present for follow up for abnormal bleeding.

## 2020-04-10 ENCOUNTER — Encounter: Payer: Self-pay | Admitting: Obstetrics and Gynecology

## 2020-07-18 ENCOUNTER — Ambulatory Visit (LOCAL_COMMUNITY_HEALTH_CENTER): Payer: Self-pay

## 2020-07-18 ENCOUNTER — Other Ambulatory Visit: Payer: Self-pay

## 2020-07-18 VITALS — BP 110/62 | Ht 67.0 in | Wt 108.5 lb

## 2020-07-18 DIAGNOSIS — Z3201 Encounter for pregnancy test, result positive: Secondary | ICD-10-CM

## 2020-07-18 LAB — PREGNANCY, URINE: Preg Test, Ur: POSITIVE — AB

## 2020-07-18 MED ORDER — PRENATAL VITAMINS 28-0.8 MG PO TABS
28.0000 mg | ORAL_TABLET | Freq: Every day | ORAL | 0 refills | Status: DC
Start: 2020-07-18 — End: 2020-07-18

## 2020-07-18 NOTE — Progress Notes (Signed)
Patient very emotional about +PT.  Hasn't taken home PT. "I can't be pregnant right now".  Reports takes BC pills everyday at the same time and has not been late or missed any pills.  Denies any medications previously including antibiotics.  [facility]. Richmond Campbell, RN

## 2020-08-11 NOTE — L&D Delivery Note (Signed)
Delivery Note At 5:27 AM a viable female child was delivered via Vaginal, Spontaneous (Presentation:   Occiput Anterior).  APGAR: 9.9 ; weight  .   Placenta status: Spontaneous, Intact.  Cord: 3 vessels with the following complications: None.  Cord pH: N/A  Anesthesia:  Epidural Episiotomy:  none Lacerations:  none Suture Repair:  N/A Est. Blood Loss (mL):   Mom to postpartum.  Baby to Couplet care / Skin to Skin.  Vena Austria 03/13/2021, 5:44 AM

## 2020-10-01 ENCOUNTER — Encounter: Payer: Medicaid Other | Admitting: Obstetrics

## 2020-10-01 ENCOUNTER — Other Ambulatory Visit: Payer: Self-pay

## 2020-10-04 ENCOUNTER — Other Ambulatory Visit: Payer: Self-pay

## 2020-10-04 ENCOUNTER — Other Ambulatory Visit (HOSPITAL_COMMUNITY)
Admission: RE | Admit: 2020-10-04 | Discharge: 2020-10-04 | Disposition: A | Discharge status: Home / Self Care | Payer: Self-pay | Source: Ambulatory Visit | Attending: Obstetrics and Gynecology | Admitting: Obstetrics and Gynecology

## 2020-10-04 ENCOUNTER — Encounter: Payer: Medicaid Other | Admitting: Obstetrics and Gynecology

## 2020-10-04 ENCOUNTER — Ambulatory Visit (INDEPENDENT_AMBULATORY_CARE_PROVIDER_SITE_OTHER): Payer: Medicaid Other | Admitting: Obstetrics and Gynecology

## 2020-10-04 ENCOUNTER — Encounter: Payer: Self-pay | Admitting: Obstetrics and Gynecology

## 2020-10-04 VITALS — BP 104/70 | Ht 67.0 in | Wt 119.0 lb

## 2020-10-04 DIAGNOSIS — Z7185 Encounter for immunization safety counseling: Secondary | ICD-10-CM

## 2020-10-04 DIAGNOSIS — O281 Abnormal biochemical finding on antenatal screening of mother: Secondary | ICD-10-CM

## 2020-10-04 DIAGNOSIS — Z3A16 16 weeks gestation of pregnancy: Secondary | ICD-10-CM

## 2020-10-04 DIAGNOSIS — Z3482 Encounter for supervision of other normal pregnancy, second trimester: Secondary | ICD-10-CM | POA: Insufficient documentation

## 2020-10-04 DIAGNOSIS — Z3689 Encounter for other specified antenatal screening: Secondary | ICD-10-CM

## 2020-10-04 DIAGNOSIS — Z124 Encounter for screening for malignant neoplasm of cervix: Secondary | ICD-10-CM | POA: Insufficient documentation

## 2020-10-04 DIAGNOSIS — Z113 Encounter for screening for infections with a predominantly sexual mode of transmission: Secondary | ICD-10-CM

## 2020-10-04 DIAGNOSIS — Z0283 Encounter for blood-alcohol and blood-drug test: Secondary | ICD-10-CM

## 2020-10-04 DIAGNOSIS — Z3149 Encounter for other procreative investigation and testing: Secondary | ICD-10-CM

## 2020-10-04 LAB — POCT URINALYSIS DIPSTICK OB
Glucose, UA: NEGATIVE
POC,PROTEIN,UA: NEGATIVE
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
## 2020-10-05 LAB — RPR+RH+ABO+RUB AB+AB SCR+CB...
Antibody Screen: NEGATIVE
HIV Screen 4th Generation wRfx: NONREACTIVE
Hematocrit: 32.8 % — ABNORMAL LOW (ref 34.0–46.6)
Hemoglobin: 11 g/dL — ABNORMAL LOW (ref 11.1–15.9)
Hepatitis B Surface Ag: NEGATIVE
MCH: 30.6 pg (ref 26.6–33.0)
MCHC: 33.5 g/dL (ref 31.5–35.7)
MCV: 91 fL (ref 79–97)
Platelets: 173 10*3/uL (ref 150–450)
RBC: 3.6 x10E6/uL — ABNORMAL LOW (ref 3.77–5.28)
RDW: 12.4 % (ref 11.7–15.4)
RPR Ser Ql: NONREACTIVE
Rh Factor: POSITIVE
Rubella Antibodies, IGG: 1.42 index (ref >0.99)
Varicella zoster IgG: 251 index (ref >165)
WBC: 8.4 10*3/uL (ref 3.4–10.8)

## 2020-10-05 LAB — URINE DRUG PANEL 7
Amphetamines, Urine: NEGATIVE ng/mL
Barbiturate Quant, Ur: NEGATIVE ng/mL
Benzodiazepine Quant, Ur: NEGATIVE ng/mL
Cannabinoid Quant, Ur: NEGATIVE ng/mL
Cocaine (Metab.): NEGATIVE ng/mL
Opiate Quant, Ur: NEGATIVE ng/mL
PCP Quant, Ur: NEGATIVE ng/mL

## 2020-10-06 LAB — URINE CULTURE

## 2020-10-08 LAB — CYTOLOGY - PAP: Diagnosis: NEGATIVE

## 2020-10-17 ENCOUNTER — Other Ambulatory Visit: Payer: Self-pay

## 2020-10-17 ENCOUNTER — Ambulatory Visit (INDEPENDENT_AMBULATORY_CARE_PROVIDER_SITE_OTHER): Payer: Medicaid Other | Admitting: Obstetrics & Gynecology

## 2020-10-17 ENCOUNTER — Ambulatory Visit (INDEPENDENT_AMBULATORY_CARE_PROVIDER_SITE_OTHER): Payer: Medicaid Other

## 2020-10-17 ENCOUNTER — Encounter: Payer: Self-pay | Admitting: Obstetrics & Gynecology

## 2020-10-17 VITALS — BP 110/70 | Wt 118.0 lb

## 2020-10-17 DIAGNOSIS — Z1379 Encounter for other screening for genetic and chromosomal anomalies: Secondary | ICD-10-CM

## 2020-10-17 DIAGNOSIS — Z3A18 18 weeks gestation of pregnancy: Secondary | ICD-10-CM

## 2020-10-17 DIAGNOSIS — Z3689 Encounter for other specified antenatal screening: Secondary | ICD-10-CM

## 2020-10-17 DIAGNOSIS — Z3482 Encounter for supervision of other normal pregnancy, second trimester: Secondary | ICD-10-CM

## 2020-10-17 LAB — INHERITEST CORE(CF97,SMA,FRAX)

## 2020-10-17 NOTE — Patient Instructions (Signed)
Thank you for choosing [facility]. As part of our ongoing efforts to improve patient experience, we would appreciate your feedback. Please fill out the short survey that you will receive by mail or MyChart. Your opinion is important to us! -Dr Beauty Pless  Second Trimester of Pregnancy  The second trimester of pregnancy is from week 13 through week 27. This is months 4 through 6 of pregnancy. The second trimester is often a time when you feel your best. Your body has adjusted to being pregnant, and you begin to feel better physically. During the second trimester:  Morning sickness has lessened or stopped completely.  You may have more energy.  You may have an increase in appetite. The second trimester is also a time when the unborn baby (fetus) is growing rapidly. At the end of the sixth month, the fetus may be up to 12 inches long and weigh about 1 pounds. You will likely begin to feel the baby move (quickening) between 16 and 20 weeks of pregnancy. Body changes during your second trimester Your body continues to go through many changes during your second trimester. The changes vary and generally return to normal after the baby is born. Physical changes  Your weight will continue to increase. You will notice your lower abdomen bulging out.  You may begin to get stretch marks on your hips, abdomen, and breasts.  Your breasts will continue to grow and to become tender.  Dark spots or blotches (chloasma or mask of pregnancy) may develop on your face.  A dark line from your belly button to the pubic area (linea nigra) may appear.  You may have changes in your hair. These can include thickening of your hair, rapid growth, and changes in texture. Some people also have hair loss during or after pregnancy, or hair that feels dry or thin. Health changes  You may develop headaches.  You may have heartburn.  You may develop constipation.  You may develop hemorrhoids or swollen, bulging  veins (varicose veins).  Your gums may bleed and may be sensitive to brushing and flossing.  You may urinate more often because the fetus is pressing on your bladder.  You may have back pain. This is caused by: ? Weight gain. ? Pregnancy hormones that are relaxing the joints in your pelvis. ? A shift in weight and the muscles that support your balance. Follow these instructions at home: Medicines  Follow your health care provider's instructions regarding medicine use. Specific medicines may be either safe or unsafe to take during pregnancy. Do not take any medicines unless approved by your health care provider.  Take a prenatal vitamin that contains at least 600 micrograms (mcg) of folic acid. Eating and drinking  Eat a healthy diet that includes fresh fruits and vegetables, whole grains, good sources of protein such as meat, eggs, or tofu, and low-fat dairy products.  Avoid raw meat and unpasteurized juice, milk, and cheese. These carry germs that can harm you and your baby.  You may need to take these actions to prevent or treat constipation: ? Drink enough fluid to keep your urine pale yellow. ? Eat foods that are high in fiber, such as beans, whole grains, and fresh fruits and vegetables. ? Limit foods that are high in fat and processed sugars, such as fried or sweet foods. Activity  Exercise only as directed by your health care provider. Most people can continue their usual exercise routine during pregnancy. Try to exercise for 30 minutes at least   5 days a week. Stop exercising if you develop contractions in your uterus.  Stop exercising if you develop pain or cramping in the lower abdomen or lower back.  Avoid exercising if it is very hot or humid or if you are at a high altitude.  Avoid heavy lifting.  If you choose to, you may have sex unless your health care provider tells you not to. Relieving pain and discomfort  Wear a supportive bra to prevent discomfort from  breast tenderness.  Take warm sitz baths to soothe any pain or discomfort caused by hemorrhoids. Use hemorrhoid cream if your health care provider approves.  Rest with your legs raised (elevated) if you have leg cramps or low back pain.  If you develop varicose veins: ? Wear support hose as told by your health care provider. ? Elevate your feet for 15 minutes, 3-4 times a day. ? Limit salt in your diet. Safety  Wear your seat belt at all times when driving or riding in a car.  Talk with your health care provider if someone is verbally or physically abusive to you. Lifestyle  Do not use hot tubs, steam rooms, or saunas.  Do not douche. Do not use tampons or scented sanitary pads.  Avoid cat litter boxes and soil used by cats. These carry germs that can cause birth defects in the baby and possibly loss of the fetus by miscarriage or stillbirth.  Do not use herbal remedies, alcohol, illegal drugs, or medicines that are not approved by your health care provider. Chemicals in these products can harm your baby.  Do not use any products that contain nicotine or tobacco, such as cigarettes, e-cigarettes, and chewing tobacco. If you need help quitting, ask your health care provider. General instructions  During a routine prenatal visit, your health care provider will do a physical exam and other tests. He or she will also discuss your overall health. Keep all follow-up visits. This is important.  Ask your health care provider for a referral to a local prenatal education class.  Ask for help if you have counseling or nutritional needs during pregnancy. Your health care provider can offer advice or refer you to specialists for help with various needs. Where to find more information  American Pregnancy Association: americanpregnancy.org  American College of Obstetricians and Gynecologists: acog.org/en/Womens%20Health/Pregnancy  Office on Women's Health: womenshealth.gov/pregnancy Contact  a health care provider if you have:  A headache that does not go away when you take medicine.  Vision changes or you see spots in front of your eyes.  Mild pelvic cramps, pelvic pressure, or nagging pain in the abdominal area.  Persistent nausea, vomiting, or diarrhea.  A bad-smelling vaginal discharge or foul-smelling urine.  Pain when you urinate.  Sudden or extreme swelling of your face, hands, ankles, feet, or legs.  A fever. Get help right away if you:  Have fluid leaking from your vagina.  Have spotting or bleeding from your vagina.  Have severe abdominal cramping or pain.  Have difficulty breathing.  Have chest pain.  Have fainting spells.  Have not felt your baby move for the time period told by your health care provider.  Have new or increased pain, swelling, or redness in an arm or leg. Summary  The second trimester of pregnancy is from week 13 through week 27 (months 4 through 6).  Do not use herbal remedies, alcohol, illegal drugs, or medicines that are not approved by your health care provider. Chemicals in these products can   harm your baby.  Exercise only as directed by your health care provider. Most people can continue their usual exercise routine during pregnancy.  Keep all follow-up visits. This is important. This information is not intended to replace advice given to you by your health care provider. Make sure you discuss any questions you have with your health care provider. Document Revised: 01/04/2020 Document Reviewed: 11/10/2019 Elsevier Patient Education  2021 Elsevier Inc.  

## 2020-10-17 NOTE — Progress Notes (Signed)
  Subjective  Fetal Movement? yes Nausea? no Leaking Fluid? no Vaginal Bleeding? no  Objective  BP 110/70   Wt 118 lb (53.5 kg)   LMP 06/08/2020 (Approximate)   BMI 18.48 kg/m  General: NAD Pumonary: no increased work of breathing Abdomen: gravid, non-tender Extremities: no edema Psychiatric: mood appropriate, affect full  Assessment  30 y.o. [id number] at [redacted]w[redacted]d by  03/15/2021, Date entered prior to episode creation presenting for routine prenatal visit  Plan   Problem List Items Addressed This Visit      Other   Encounter for supervision of other normal pregnancy, second trimester    Other Visit Diagnoses    [redacted] weeks gestation of pregnancy    -  Primary   Encounter for genetic screening for Down Syndrome       Relevant Orders   MaterniT21 PLUS Core+SCA    Review of ULTRASOUND. I have personally reviewed images and report of recent ultrasound done at [facility]. There is a singleton gestation with subjectively normal amniotic fluid volume. The fetal biometry correlates with established dating. Detailed evaluation of the fetal anatomy was performed.The fetal anatomical survey appears within normal limits within the resolution of ultrasound as described above.  It must be noted that a normal ultrasound is unable to rule out fetal aneuploidy.    PNV NIPT today  Annamarie Major, MD, [facility], [facility] 10/17/2020  11:29 AM

## 2020-10-24 LAB — MATERNIT21 PLUS CORE+SCA
Fetal Fraction: 7
Monosomy X (Turner Syndrome): NOT DETECTED
Result (T21): NEGATIVE
Trisomy 13 (Patau syndrome): NEGATIVE
Trisomy 18 (Edwards syndrome): NEGATIVE
Trisomy 21 (Down syndrome): NEGATIVE
XXX (Triple X Syndrome): NOT DETECTED
XXY (Klinefelter Syndrome): NOT DETECTED
XYY (Jacobs Syndrome): NOT DETECTED

## 2020-11-14 ENCOUNTER — Other Ambulatory Visit: Payer: Self-pay

## 2020-11-14 ENCOUNTER — Ambulatory Visit (INDEPENDENT_AMBULATORY_CARE_PROVIDER_SITE_OTHER): Payer: Medicaid Other | Admitting: Obstetrics and Gynecology

## 2020-11-14 ENCOUNTER — Encounter: Payer: Self-pay | Admitting: Obstetrics and Gynecology

## 2020-11-14 VITALS — BP 118/74 | Wt 127.0 lb

## 2020-11-14 DIAGNOSIS — Z3A22 22 weeks gestation of pregnancy: Secondary | ICD-10-CM

## 2020-11-14 DIAGNOSIS — Z113 Encounter for screening for infections with a predominantly sexual mode of transmission: Secondary | ICD-10-CM

## 2020-11-14 DIAGNOSIS — Z131 Encounter for screening for diabetes mellitus: Secondary | ICD-10-CM

## 2020-11-14 DIAGNOSIS — Z3482 Encounter for supervision of other normal pregnancy, second trimester: Secondary | ICD-10-CM

## 2020-11-14 NOTE — Progress Notes (Signed)
  Routine Prenatal Care Visit  Subjective  Tracie Meyer is a 30 y.o. [phone] at [redacted]w[redacted]d being seen today for ongoing prenatal care.  She is currently monitored for the following issues for this low-risk pregnancy and has Encounter for supervision of other normal pregnancy, second trimester on their problem list.  ----------------------------------------------------------------------------------- Patient reports no complaints.    . Vag. Bleeding: None.  Movement: Present. Leaking Fluid denies.  ----------------------------------------------------------------------------------- The following portions of the patient's history were reviewed and updated as appropriate: allergies, current medications, past family history, past medical history, past social history, past surgical history and problem list. Problem list updated.  Objective  Blood pressure 118/74, weight 127 lb (57.6 kg), last menstrual period 06/08/2020. Pregravid weight 110 lb (49.9 kg) Total Weight Gain 17 lb (7.711 kg) Urinalysis: Urine Protein    Urine Glucose    Fetal Status: Fetal Heart Rate (bpm): 135   Movement: Present     General:  Alert, oriented and cooperative. Patient is in no acute distress.  Skin: Skin is warm and dry. No rash noted.   Cardiovascular: Normal heart rate noted  Respiratory: Normal respiratory effort, no problems with respiration noted  Abdomen: Soft, gravid, appropriate for gestational age. Pain/Pressure: Absent     Pelvic:  Cervical exam deferred        Extremities: Normal range of motion.  Edema: None  Mental Status: Normal mood and affect. Normal behavior. Normal judgment and thought content.   Assessment   30 y.o. [id number] at [redacted]w[redacted]d by  03/15/2021, Date entered prior to episode creation presenting for routine prenatal visit  Plan   pregnancy 3  Problems (from 10/04/20 to present)    Problem Noted Resolved   Encounter for supervision of other normal pregnancy, second trimester 10/04/2020 by Zipporah Plants, CNM No   Overview Addendum 11/14/2020 11:29 AM by Conard Novak, MD     Nursing Staff Provider  Office Location  [facility] Dating   LMP  Language  English Anatomy US  complete  Flu Vaccine   declined Genetic Screen  NIPS: Diploid XX  TDaP vaccine    Hgb A1C or  GTT Third trimester :   Rhogam   n/a   LAB RESULTS   Feeding Plan  formula Blood Type O/Positive/-- (02/24 1018)   Contraception  Antibody Negative (02/24 1018) negative   Circumcision  Rubella  immune  Pediatrician   RPR Non Reactive (02/24 1018) non-reactive  Support Person  Thurston Pounds - boyfriend HBsAg Negative (02/24 1018) negative  Prenatal Classes  declined HIV Non Reactive (02/24 1018) non-reactive    Varicella   immune  BTL Consent  n/a GBS  (For PCN allergy, check sensitivities)        VBAC Consent  n/a Pap  10/04/20 - NILM    Hgb Electro   n/a   Covid  unvaccinated - encouraged at [facility]      SMA             Previous Version        Preterm labor symptoms and general obstetric precautions including but not limited to vaginal bleeding, contractions, leaking of fluid and fetal movement were reviewed in detail with the patient. Please refer to After Visit Summary for other counseling recommendations.   Return in about 4 weeks (around 12/12/2020) for 28 week labs and routine prenatal.   Thomasene Mohair, MD, [facility], [facility] 11/14/2020 11:28 AM

## 2020-11-16 IMAGING — US US OB < 14 WKS - US OB TV - US DOPPLER
1 series · 13 of 28 positions shown · non-contrast
Comparison: 11/25/2019

CLINICAL DATA: Vaginal bleeding, pelvic pain. Recent dilation and
curettage 11/25/2019



[Series 1: us ob < 14 wks - us ob tv - us doppler · 0.19mm/px · 13 of 132 slices shown]
[im 5/132]
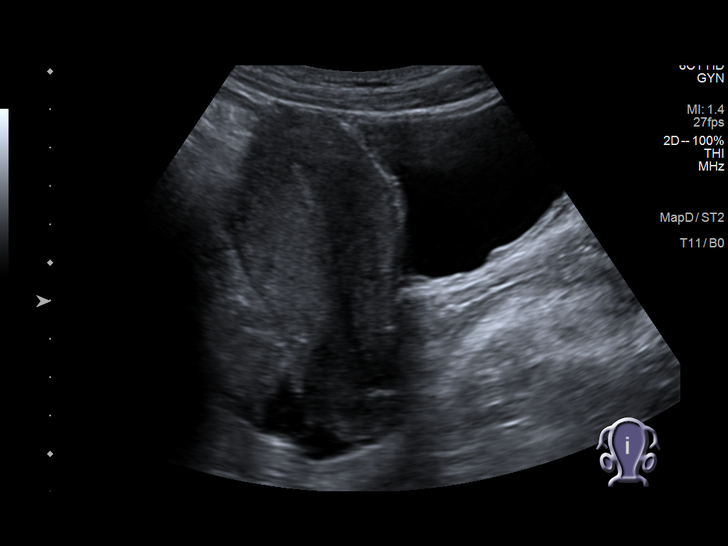
[im 15/132]
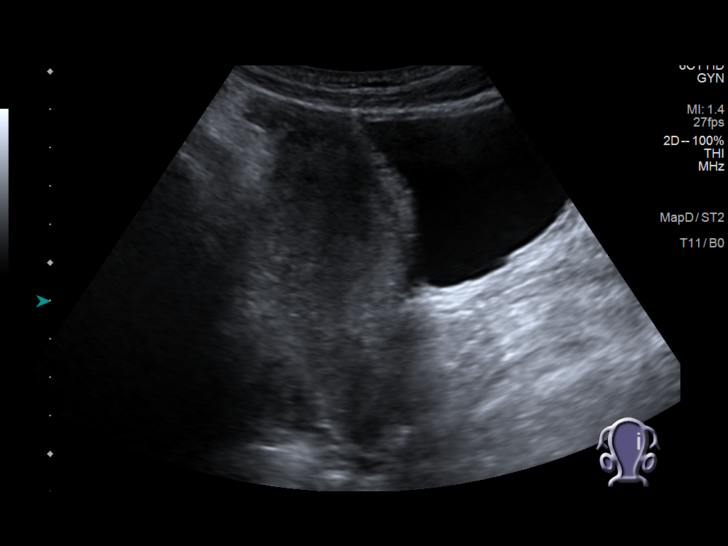
[im 25/132]
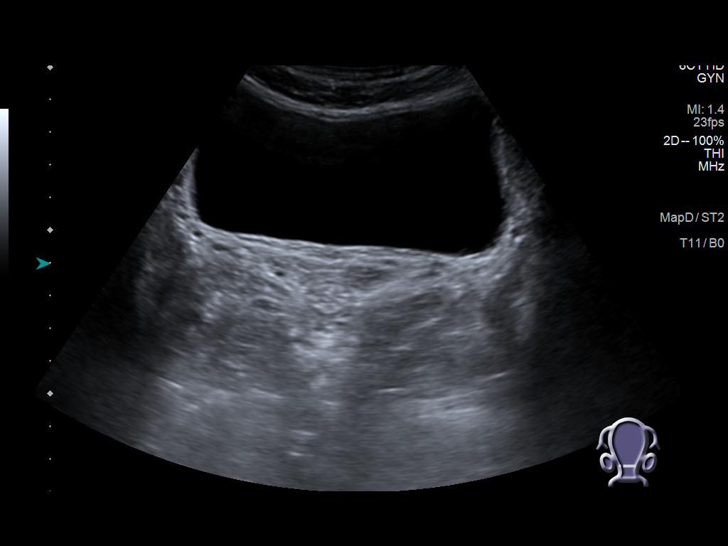
[im 34/132]
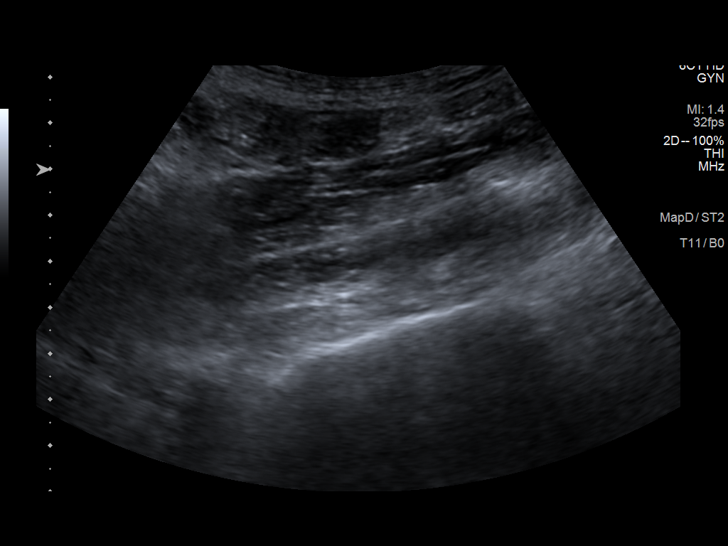
[im 44/132]
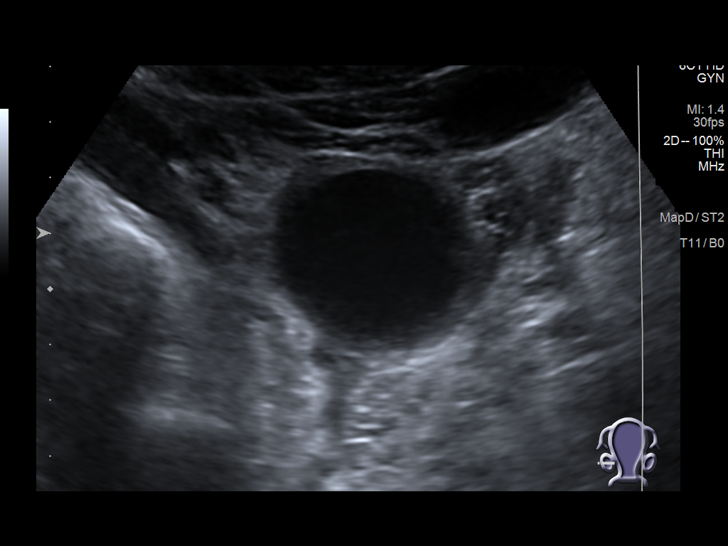
[im 54/132]
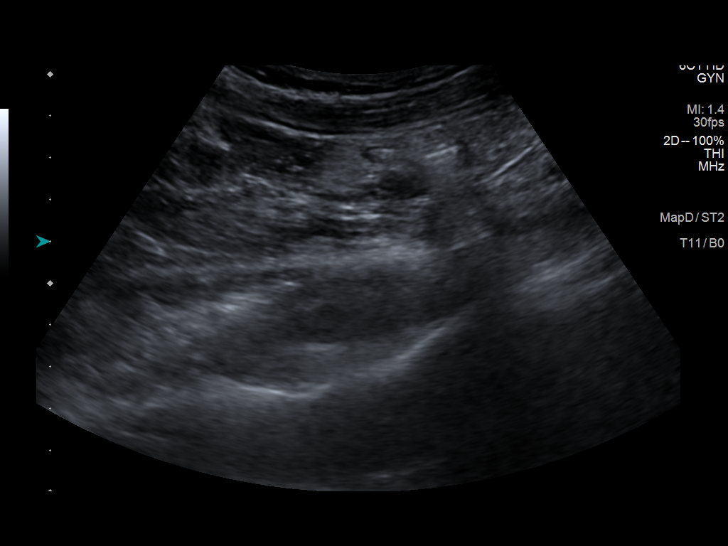
[im 68/132]
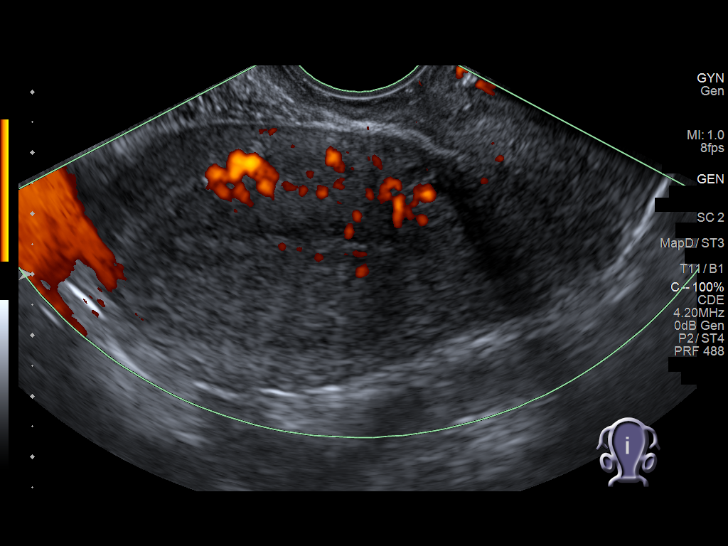
[im 78/132]
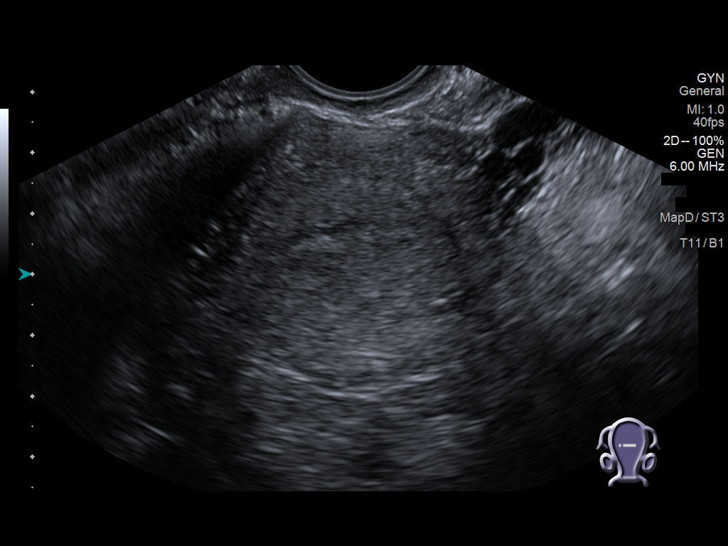
[im 88/132]
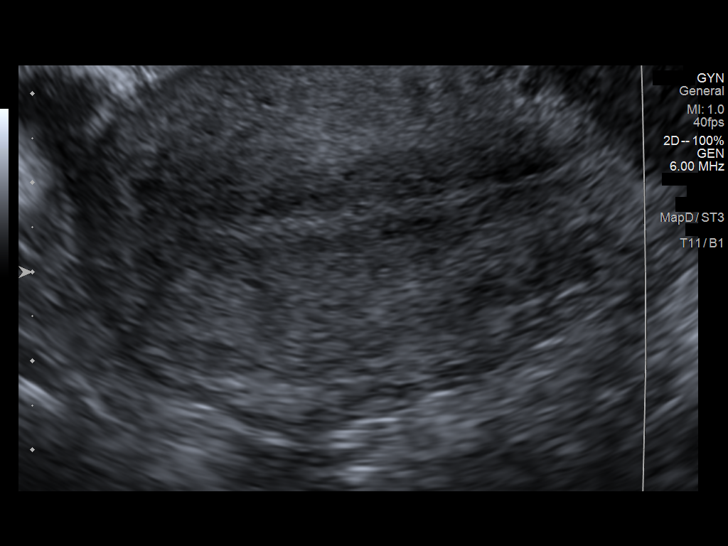
[im 98/132]
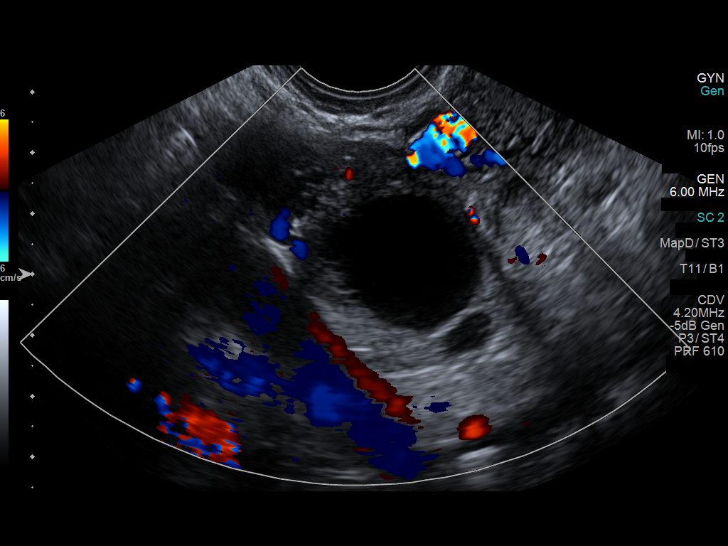
[im 107/132]
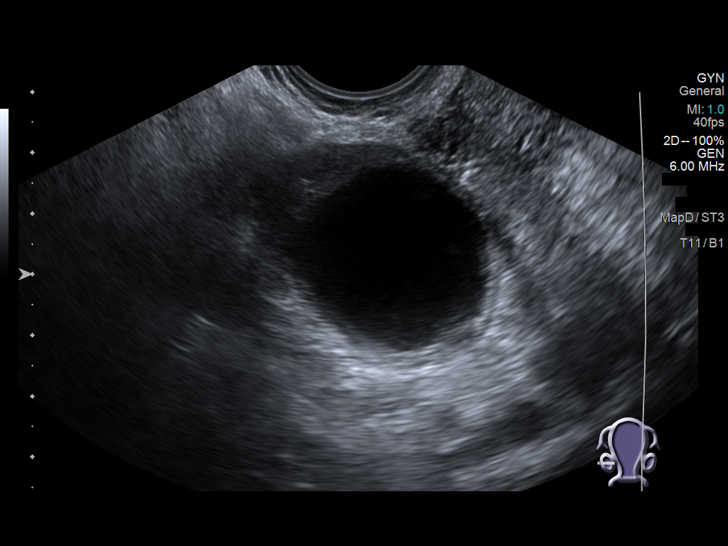
[im 117/132]
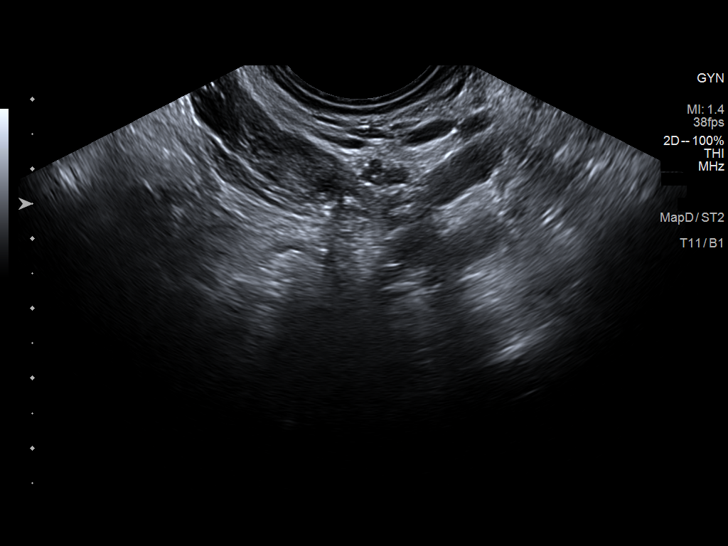
[im 127/132]
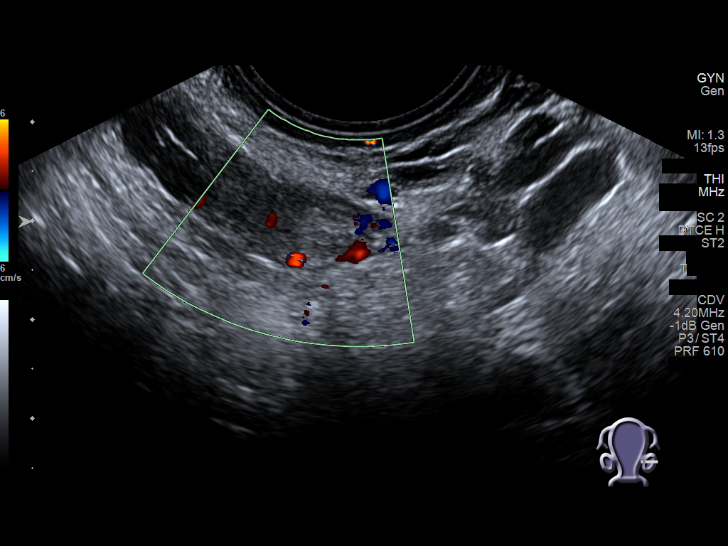

[13 of 28 positions shown; findings below may reference images not displayed]

FINDINGS: Intrauterine gestational sac: None

Yolk sac:  Not Visualized.

Embryo:  Not Visualized.

Cardiac Activity: Not Visualized.

Maternal uterus/adnexae: Anteverted uterus measuring 8.9 x 4.4 x
cm, volume 109 mL. Uterus appears unremarkable without fibroid or
uterine mass. Endometrial thickness of 4 mm, within normal limits.
No gestational sac or debris within the endometrial canal.

The right ovary measures 3.7 x 3.4 x 3.8 cm, 24 mL. Right ovarian
cyst measuring 3.4 cm. Right ovary appears otherwise unremarkable.

The left ovary measures 2.0 x 1.6 x 1.4 cm, volume 2 mL.

Pulsed Doppler evaluation of both ovaries demonstrates normal
appearing low-resistance arterial and venous waveforms.

No free fluid within the pelvis.
IMPRESSION: 1. Unremarkable sonographic appearance of the pelvis. Previously
seen gestational sac is no longer identified. There is no evidence
of intrauterine pregnancy or retained products of conception.
2. Negative for adnexal torsion.
3. No free fluid within the pelvis.

## 2020-12-12 ENCOUNTER — Encounter: Payer: Self-pay | Admitting: Obstetrics and Gynecology

## 2020-12-12 ENCOUNTER — Other Ambulatory Visit: Payer: Self-pay

## 2020-12-12 ENCOUNTER — Ambulatory Visit (INDEPENDENT_AMBULATORY_CARE_PROVIDER_SITE_OTHER): Payer: Medicaid Other | Admitting: Obstetrics and Gynecology

## 2020-12-12 ENCOUNTER — Other Ambulatory Visit: Payer: Medicaid Other

## 2020-12-12 VITALS — BP 100/70 | Ht 67.0 in | Wt 135.7 lb

## 2020-12-12 DIAGNOSIS — Z3482 Encounter for supervision of other normal pregnancy, second trimester: Secondary | ICD-10-CM

## 2020-12-12 DIAGNOSIS — Z3A26 26 weeks gestation of pregnancy: Secondary | ICD-10-CM

## 2020-12-12 NOTE — Patient Instructions (Signed)

## 2020-12-12 NOTE — Progress Notes (Signed)
    Routine Prenatal Care Visit  Subjective  Tracie Meyer is a 30 y.o. [phone] at [redacted]w[redacted]d being seen today for ongoing prenatal care.  She is currently monitored for the following issues for this low-risk pregnancy and has Encounter for supervision of other normal pregnancy, second trimester on their problem list.  ----------------------------------------------------------------------------------- Patient reports no complaints.   Contractions: Not present. Vag. Bleeding: None.  Movement: Present. Denies leaking of fluid.  ----------------------------------------------------------------------------------- The following portions of the patient's history were reviewed and updated as appropriate: allergies, current medications, past family history, past medical history, past social history, past surgical history and problem list. Problem list updated.   Objective  Blood pressure 100/70, height 5\' 7"  (1.702 m), weight 135 lb 11.2 oz (61.6 kg), last menstrual period 06/08/2020. Pregravid weight 110 lb (49.9 kg) Total Weight Gain 25 lb 11.2 oz (11.7 kg) Urinalysis:      Fetal Status: Fetal Heart Rate (bpm): 150 Fundal Height: 26 cm Movement: Present     General:  Alert, oriented and cooperative. Patient is in no acute distress.  Skin: Skin is warm and dry. No rash noted.   Cardiovascular: Normal heart rate noted  Respiratory: Normal respiratory effort, no problems with respiration noted  Abdomen: Soft, gravid, appropriate for gestational age. Pain/Pressure: Absent     Pelvic:  Cervical exam deferred        Extremities: Normal range of motion.  Edema: None  Mental Status: Normal mood and affect. Normal behavior. Normal judgment and thought content.     Assessment   30 y.o. 34 at [redacted]w[redacted]d by  03/15/2021, Date entered prior to episode creation presenting for routine prenatal visit  Plan   pregnancy 3  Problems (from 10/04/20 to present)    Problem Noted Resolved   Encounter for  supervision of other normal pregnancy, second trimester 10/04/2020 by 10/06/2020, CNM No   Overview Addendum 11/14/2020 11:29 AM by 01/14/2021, MD     Nursing Staff Provider  Office Location  [facility] Dating   LMP  Language  English Anatomy Conard Novak  complete  Flu Vaccine   declined Genetic Screen  NIPS: Diploid XX  TDaP vaccine    Hgb A1C or  GTT Third trimester :   Rhogam   n/a   LAB RESULTS   Feeding Plan  formula Blood Type O/Positive/-- (02/24 1018)   Contraception  Antibody Negative (02/24 1018) negative   Circumcision  Rubella  immune  Pediatrician   RPR Non Reactive (02/24 1018) non-reactive  Support Person  12-16-1994 - boyfriend HBsAg Negative (02/24 1018) negative  Prenatal Classes  declined HIV Non Reactive (02/24 1018) non-reactive    Varicella   immune  BTL Consent  n/a GBS  (For PCN allergy, check sensitivities)        VBAC Consent  n/a Pap  10/04/20 - NILM    Hgb Electro   n/a   Covid  unvaccinated - encouraged at [facility]      SMA               Previous Version       Discussed contraception options 28 week labs today  Gestational age appropriate obstetric precautions including but not limited to vaginal bleeding, contractions, leaking of fluid and fetal movement were reviewed in detail with the patient.    Return in about 2 weeks (around 12/26/2020) for rob in person.  12/28/2020 MD [facility], [facility] 12/12/2020, 10:37 AM

## 2020-12-13 LAB — 28 WEEK RH+PANEL
Basophils Absolute: 0.1 10*3/uL (ref 0.0–0.2)
Basos: 1 %
EOS (ABSOLUTE): 0.2 10*3/uL (ref 0.0–0.4)
Eos: 2 %
Gestational Diabetes Screen: 73 mg/dL (ref 65–139)
HIV Screen 4th Generation wRfx: NONREACTIVE
Hematocrit: 31.7 % — ABNORMAL LOW (ref 34.0–46.6)
Hemoglobin: 10.5 g/dL — ABNORMAL LOW (ref 11.1–15.9)
Immature Grans (Abs): 0.1 10*3/uL (ref 0.0–0.1)
Immature Granulocytes: 1 %
Lymphocytes Absolute: 2.3 10*3/uL (ref 0.7–3.1)
Lymphs: 23 %
MCH: 30.3 pg (ref 26.6–33.0)
MCHC: 33.1 g/dL (ref 31.5–35.7)
MCV: 91 fL (ref 79–97)
Monocytes Absolute: 0.6 10*3/uL (ref 0.1–0.9)
Monocytes: 6 %
Neutrophils Absolute: 6.8 10*3/uL (ref 1.4–7.0)
Neutrophils: 67 %
Platelets: 189 10*3/uL (ref 150–450)
RBC: 3.47 x10E6/uL — ABNORMAL LOW (ref 3.77–5.28)
RDW: 12.2 % (ref 11.7–15.4)
RPR Ser Ql: NONREACTIVE
WBC: 10.1 10*3/uL (ref 3.4–10.8)

## 2020-12-26 ENCOUNTER — Encounter: Payer: Self-pay | Admitting: Obstetrics and Gynecology

## 2020-12-26 ENCOUNTER — Ambulatory Visit (INDEPENDENT_AMBULATORY_CARE_PROVIDER_SITE_OTHER): Payer: Medicaid Other | Admitting: Obstetrics and Gynecology

## 2020-12-26 ENCOUNTER — Other Ambulatory Visit: Payer: Self-pay

## 2020-12-26 VITALS — BP 105/70 | Ht 67.0 in | Wt 136.6 lb

## 2020-12-26 DIAGNOSIS — Z3A28 28 weeks gestation of pregnancy: Secondary | ICD-10-CM

## 2020-12-26 DIAGNOSIS — Z3482 Encounter for supervision of other normal pregnancy, second trimester: Secondary | ICD-10-CM

## 2020-12-26 LAB — POCT URINALYSIS DIPSTICK OB
Glucose, UA: NEGATIVE
POC,PROTEIN,UA: NEGATIVE

## 2020-12-26 NOTE — Progress Notes (Signed)
Routine Prenatal Care Visit  Subjective  Tracie Meyer is a 30 y.o. [phone] at [redacted]w[redacted]d being seen today for ongoing prenatal care.  She is currently monitored for the following issues for this low-risk pregnancy and has Encounter for supervision of other normal pregnancy, second trimester on their problem list.  ----------------------------------------------------------------------------------- Patient reports no complaints.   Contractions: Not present. Vag. Bleeding: None.  Movement: Present. Denies leaking of fluid.  ----------------------------------------------------------------------------------- The following portions of the patient's history were reviewed and updated as appropriate: allergies, current medications, past family history, past medical history, past social history, past surgical history and problem list. Problem list updated.   Objective  Blood pressure 105/70, height 5\' 7"  (1.702 m), weight 136 lb 9.6 oz (62 kg), last menstrual period 06/08/2020. Pregravid weight 110 lb (49.9 kg) Total Weight Gain 26 lb 9.6 oz (12.1 kg) Urinalysis:      Fetal Status: Fetal Heart Rate (bpm): 135 Fundal Height: 28 cm Movement: Present     General:  Alert, oriented and cooperative. Patient is in no acute distress.  Skin: Skin is warm and dry. No rash noted.   Cardiovascular: Normal heart rate noted  Respiratory: Normal respiratory effort, no problems with respiration noted  Abdomen: Soft, gravid, appropriate for gestational age. Pain/Pressure: Absent     Pelvic:  Cervical exam deferred        Extremities: Normal range of motion.  Edema: None  Mental Status: Normal mood and affect. Normal behavior. Normal judgment and thought content.     Assessment   30 y.o. 26 at [redacted]w[redacted]d by  03/15/2021, Date entered prior to episode creation presenting for routine prenatal visit  Plan   pregnancy 3  Problems (from 10/04/20 to present)    Problem Noted Resolved   Encounter for supervision  of other normal pregnancy, second trimester 10/04/2020 by 10/06/2020, CNM No   Overview Addendum 12/12/2020 10:37 AM by 02/11/2021, MD     Nursing Staff Provider  Office Location  [facility] Dating   LMP  Language  English Anatomy Natale Milch  complete  Flu Vaccine   declined Genetic Screen  NIPS: Diploid XX  TDaP vaccine    Hgb A1C or  GTT Third trimester :   Rhogam   n/a   LAB RESULTS   Feeding Plan  formula Blood Type O/Positive/-- (02/24 1018)   Contraception  Depo Provera Antibody Negative (02/24 1018) negative   Circumcision  Rubella  immune  Pediatrician   RPR Non Reactive (02/24 1018) non-reactive  Support Person  12-16[birth year]1996 - boyfriend HBsAg Negative (02/24 1018) negative  Prenatal Classes  declined HIV Non Reactive (02/24 1018) non-reactive    Varicella   immune  BTL Consent  n/a GBS  (For PCN allergy, check sensitivities)        VBAC Consent  n/a Pap  10/04/20 - NILM    Hgb Electro   n/a   Covid  unvaccinated - encouraged at [facility]      SMA               Previous Version      Provided with list of Iron rich foods, she is starting an iron supplement  Gestational age appropriate obstetric precautions including but not limited to vaginal bleeding, contractions, leaking of fluid and fetal movement were reviewed in detail with the patient.    Return in about 2 weeks (around 01/09/2021) for ROB in person.  03/11/2021 MD [facility], [facility]  12/26/2020, 10:19 AM

## 2020-12-26 NOTE — Patient Instructions (Signed)

## 2021-01-09 ENCOUNTER — Encounter: Payer: Medicaid Other | Admitting: Obstetrics

## 2021-01-11 ENCOUNTER — Encounter: Payer: Self-pay | Admitting: Advanced Practice Midwife

## 2021-01-11 ENCOUNTER — Ambulatory Visit (INDEPENDENT_AMBULATORY_CARE_PROVIDER_SITE_OTHER): Payer: Medicaid Other | Admitting: Advanced Practice Midwife

## 2021-01-11 ENCOUNTER — Other Ambulatory Visit: Payer: Self-pay

## 2021-01-11 VITALS — BP 100/60 | Wt 142.0 lb

## 2021-01-11 DIAGNOSIS — Z3A31 31 weeks gestation of pregnancy: Secondary | ICD-10-CM

## 2021-01-11 DIAGNOSIS — Z23 Encounter for immunization: Secondary | ICD-10-CM | POA: Diagnosis not present

## 2021-01-11 DIAGNOSIS — Z3483 Encounter for supervision of other normal pregnancy, third trimester: Secondary | ICD-10-CM

## 2021-01-11 NOTE — Patient Instructions (Signed)
Back Pain in Pregnancy Back pain during pregnancy is common. Back pain may be caused by several factors that are related to changes during your pregnancy. Follow these instructions at home: Managing pain, stiffness, and swelling  If directed, for sudden (acute) back pain, put ice on the painful area. ? Put ice in a plastic bag. ? Place a towel between your skin and the bag. ? Leave the ice on for 20 minutes, 2-3 times per day.  If directed, apply heat to the affected area before you exercise. Use the heat source that your health care provider recommends, such as a moist heat pack or a heating pad. ? Place a towel between your skin and the heat source. ? Leave the heat on for 20-30 minutes. ? Remove the heat if your skin turns bright red. This is especially important if you are unable to feel pain, heat, or cold. You may have a greater risk of getting burned.  If directed, massage the affected area.      Activity  Exercise as told by your health care provider. Gentle exercise is the best way to prevent or manage back pain.  Listen to your body when lifting. If lifting hurts, ask for help or bend your knees. This uses your leg muscles instead of your back muscles.  Squat down when picking up something from the floor. Do not bend over.  Only use bed rest for short periods as told by your health care provider. Bed rest should only be used for the most severe episodes of back pain. Standing, sitting, and lying down  Do not stand in one place for long periods of time.  Use good posture when sitting. Make sure your head rests over your shoulders and is not hanging forward. Use a pillow on your lower back if necessary.  Try sleeping on your side, preferably the left side, with a pregnancy support pillow or 1-2 regular pillows between your legs. ? If you have back pain after a night's rest, your bed may be too soft. ? A firm mattress may provide more support for your back during  pregnancy. General instructions  Do not wear high heels.  Eat a healthy diet. Try to gain weight within your health care provider's recommendations.  Use a maternity girdle, elastic sling, or back brace as told by your health care provider.  Take over-the-counter and prescription medicines only as told by your health care provider.  Work with a physical therapist or massage therapist to find ways to manage back pain. Acupuncture or massage therapy may be helpful.  Keep all follow-up visits as told by your health care provider. This is important. Contact a health care provider if:  Your back pain interferes with your daily activities.  You have increasing pain in other parts of your body. Get help right away if:  You develop numbness, tingling, weakness, or problems with the use of your arms or legs.  You develop severe back pain that is not controlled with medicine.  You have a change in bowel or bladder control.  You develop shortness of breath, dizziness, or you faint.  You develop nausea, vomiting, or sweating.  You have back pain that is a rhythmic, cramping pain similar to labor pains. Labor pain is usually 1-2 minutes apart, lasts for about 1 minute, and involves a bearing down feeling or pressure in your pelvis.  You have back pain and your water breaks or you have vaginal bleeding.  You have back pain or   numbness that travels down your leg.  Your back pain developed after you fell.  You develop pain on one side of your back.  You see blood in your urine.  You develop skin blisters in the area of your back pain. Summary  Back pain may be caused by several factors that are related to changes during your pregnancy.  Follow instructions as told by your health care provider for managing pain, stiffness, and swelling.  Exercise as told by your health care provider. Gentle exercise is the best way to prevent or manage back pain.  Take over-the-counter and  prescription medicines only as told by your health care provider.  Keep all follow-up visits as told by your health care provider. This is important. This information is not intended to replace advice given to you by your health care provider. Make sure you discuss any questions you have with your health care provider. Document Revised: 11/16/2018 Document Reviewed: 01/13/2018 Elsevier Patient Education  2021 [employer]  

## 2021-01-11 NOTE — Addendum Note (Signed)
Addended by: Cornelius Moras D on: 01/11/2021 11:35 AM   Modules accepted: Orders

## 2021-01-11 NOTE — Progress Notes (Signed)
  Routine Prenatal Care Visit  Subjective  Tracie Meyer is a 30 y.o. [phone] at [redacted]w[redacted]d being seen today for ongoing prenatal care.  She is currently monitored for the following issues for this low-risk pregnancy and has Encounter for supervision of other normal pregnancy, second trimester on their problem list.  ----------------------------------------------------------------------------------- Patient reports some low back pain .   Contractions: Not present. Vag. Bleeding: None.  Movement: Present. Leaking Fluid denies.  ----------------------------------------------------------------------------------- The following portions of the patient's history were reviewed and updated as appropriate: allergies, current medications, past family history, past medical history, past social history, past surgical history and problem list. Problem list updated.  Objective  Blood pressure 100/60, weight 142 lb (64.4 kg), last menstrual period 06/08/2020. Pregravid weight 110 lb (49.9 kg) Total Weight Gain 32 lb (14.5 kg) Urinalysis: Urine Protein    Urine Glucose    Fetal Status: Fetal Heart Rate (bpm): 148 Fundal Height: 31 cm Movement: Present     General:  Alert, oriented and cooperative. Patient is in no acute distress.  Skin: Skin is warm and dry. No rash noted.   Cardiovascular: Normal heart rate noted  Respiratory: Normal respiratory effort, no problems with respiration noted  Abdomen: Soft, gravid, appropriate for gestational age. Pain/Pressure: Absent     Pelvic:  Cervical exam deferred        Extremities: Normal range of motion.  Edema: None  Mental Status: Normal mood and affect. Normal behavior. Normal judgment and thought content.   Assessment   30 y.o. [id number] at [redacted]w[redacted]d by  03/15/2021, Date entered prior to episode creation presenting for routine prenatal visit  Plan   pregnancy 3  Problems (from 10/04/20 to present)    Problem Noted Resolved   Encounter for supervision of other  normal pregnancy, second trimester 10/04/2020 by Zipporah Plants, CNM No   Overview Addendum 12/26/2020 10:19 AM by Natale Milch, MD     Nursing Staff Provider  Office Location  [facility] Dating   LMP  Language  English Anatomy US  complete  Flu Vaccine   declined Genetic Screen  NIPS: Diploid XX  TDaP vaccine    Hgb A1C or  GTT Third trimester : 39  Rhogam   n/a   LAB RESULTS   Feeding Plan  formula Blood Type O/Positive/-- (02/24 1018)   Contraception  Depo Provera Antibody Negative (02/24 1018) negative   Circumcision  Rubella  immune  Pediatrician   RPR Non Reactive (02/24 1018) non-reactive  Support Person  Thurston Pounds - boyfriend HBsAg Negative (02/24 1018) negative  Prenatal Classes  declined HIV Non Reactive (02/24 1018) non-reactive    Varicella   immune  BTL Consent  n/a GBS  (For PCN allergy, check sensitivities)        VBAC Consent  n/a Pap  10/04/20 - NILM    Hgb Electro   n/a   Covid  unvaccinated - encouraged at [facility]      SMA         Anemia in pregnancy- starting oral iron 12/26/2020      Previous Version       Preterm labor symptoms and general obstetric precautions including but not limited to vaginal bleeding, contractions, leaking of fluid and fetal movement were reviewed in detail with the patient.    Return in about 2 weeks (around 01/25/2021) for rob.  Tresea Mall, CNM 01/11/2021 11:18 AM

## 2021-01-25 ENCOUNTER — Encounter: Payer: Medicaid Other | Admitting: Obstetrics and Gynecology

## 2021-02-05 ENCOUNTER — Encounter: Payer: Self-pay | Admitting: Obstetrics and Gynecology

## 2021-02-05 ENCOUNTER — Other Ambulatory Visit: Payer: Self-pay

## 2021-02-05 ENCOUNTER — Ambulatory Visit (INDEPENDENT_AMBULATORY_CARE_PROVIDER_SITE_OTHER): Payer: Medicaid Other | Admitting: Obstetrics and Gynecology

## 2021-02-05 VITALS — BP 118/70 | Ht 67.0 in | Wt 147.8 lb

## 2021-02-05 DIAGNOSIS — Z3483 Encounter for supervision of other normal pregnancy, third trimester: Secondary | ICD-10-CM

## 2021-02-05 DIAGNOSIS — Z3A34 34 weeks gestation of pregnancy: Secondary | ICD-10-CM

## 2021-02-05 NOTE — Progress Notes (Signed)
Routine Prenatal Care Visit  Subjective  Tracie Meyer is a 30 y.o. [phone] at [redacted]w[redacted]d being seen today for ongoing prenatal care.  She is currently monitored for the following issues for this low-risk pregnancy and has Encounter for supervision of other normal pregnancy, second trimester on their problem list.  ----------------------------------------------------------------------------------- Patient reports no complaints.  She has begun having some occassional abdominal tightening and contractions. Contractions: Irregular. Vag. Bleeding: None.  Movement: Present. Denies leaking of fluid.  ----------------------------------------------------------------------------------- The following portions of the patient's history were reviewed and updated as appropriate: allergies, current medications, past family history, past medical history, past social history, past surgical history and problem list. Problem list updated.   Objective  Blood pressure 118/70, height 5\' 7"  (1.702 m), weight 147 lb 12.8 oz (67 kg), last menstrual period 06/08/2020. Pregravid weight 110 lb (49.9 kg) Total Weight Gain 37 lb 12.8 oz (17.1 kg) Urinalysis:      Fetal Status: Fetal Heart Rate (bpm): 155 Fundal Height: 34 cm Movement: Present     General:  Alert, oriented and cooperative. Patient is in no acute distress.  Skin: Skin is warm and dry. No rash noted.   Cardiovascular: Normal heart rate noted  Respiratory: Normal respiratory effort, no problems with respiration noted  Abdomen: Soft, gravid, appropriate for gestational age. Pain/Pressure: Present     Pelvic:  Cervical exam deferred        Extremities: Normal range of motion.  Edema: None  Mental Status: Normal mood and affect. Normal behavior. Normal judgment and thought content.    Assessment   30 y.o. 26 at [redacted]w[redacted]d by  03/15/2021, Date entered prior to episode creation presenting for routine prenatal visit  Plan   pregnancy 3  Problems (from  10/04/20 to present)     Problem Noted Resolved   Encounter for supervision of other normal pregnancy, second trimester 10/04/2020 by 10/06/2020, CNM No   Overview Addendum 02/05/2021 10:20 AM by 02/07/2021, MD     Nursing Staff Provider  Office Location  [facility] Dating   LMP  Language  English Anatomy Natale Milch  complete  Flu Vaccine   declined Genetic Screen  NIPS: Diploid XX  TDaP vaccine   01/11/2021 Hgb A1C or  GTT Third trimester : 30  Rhogam   n/a   LAB RESULTS   Feeding Plan  formula Blood Type O/Positive/-- (02/24 1018)   Contraception  Depo Provera Antibody Negative (02/24 1018) negative   Circumcision  Rubella  immune  Pediatrician   RPR Non Reactive (02/24 1018) non-reactive  Support Person  12-16-1994 - boyfriend HBsAg Negative (02/24 1018) negative  Prenatal Classes  declined HIV Non Reactive (02/24 1018) non-reactive    Varicella   immune  BTL Consent  n/a GBS  (For PCN allergy, check sensitivities)        VBAC Consent  n/a Pap  10/04/20 - NILM    Hgb Electro   n/a   Covid  unvaccinated - encouraged at [facility]      SMA        Anemia in pregnancy- starting oral iron 12/26/2020            Gestational age appropriate obstetric precautions including but not limited to vaginal bleeding, contractions, leaking of fluid and fetal movement were reviewed in detail with the patient.    Return in about 2 weeks (around 02/19/2021) for ROB in person, then weekly afterwards for 3 more visits.  04/22/2021 MD Natale Milch  OB/GYN, [facility] 02/05/2021, 10:33 AM

## 2021-02-05 NOTE — Patient Instructions (Signed)

## 2021-02-20 ENCOUNTER — Other Ambulatory Visit: Payer: Self-pay

## 2021-02-20 ENCOUNTER — Telehealth: Payer: Self-pay

## 2021-02-20 ENCOUNTER — Encounter: Payer: Self-pay | Admitting: Obstetrics and Gynecology

## 2021-02-20 ENCOUNTER — Other Ambulatory Visit (HOSPITAL_COMMUNITY)
Admission: RE | Admit: 2021-02-20 | Discharge: 2021-02-20 | Disposition: A | Discharge status: Home / Self Care | Payer: Medicaid Other | Source: Ambulatory Visit | Attending: Obstetrics and Gynecology | Admitting: Obstetrics and Gynecology

## 2021-02-20 ENCOUNTER — Ambulatory Visit (INDEPENDENT_AMBULATORY_CARE_PROVIDER_SITE_OTHER): Payer: Medicaid Other | Admitting: Obstetrics and Gynecology

## 2021-02-20 VITALS — BP 116/70 | Ht 67.0 in | Wt 155.4 lb

## 2021-02-20 DIAGNOSIS — Z3A36 36 weeks gestation of pregnancy: Secondary | ICD-10-CM

## 2021-02-20 DIAGNOSIS — Z3483 Encounter for supervision of other normal pregnancy, third trimester: Secondary | ICD-10-CM | POA: Diagnosis not present

## 2021-02-20 LAB — POCT URINALYSIS DIPSTICK OB
Glucose, UA: NEGATIVE
POC,PROTEIN,UA: NEGATIVE

## 2021-02-20 NOTE — Patient Instructions (Signed)
[email or web address]">  Third Trimester of Pregnancy  The third trimester of pregnancy is from week 28 through week 40. This is months 7 through 9. The third trimester is a time when the unborn baby (fetus) is growing rapidly. At the end of the ninth month, the fetus is about 20inches long and weighs 6-10 pounds. Body changes during your third trimester During the third trimester, your body will continue to go through many changes.The changes vary and generally return to normal after your baby is born. Physical changes Your weight will continue to increase. You can expect to gain 25-35 pounds (11-16 kg) by the end of the pregnancy if you begin pregnancy at a normal weight. If you are underweight, you can expect to gain 28-40 lb (about 13-18 kg), and if you are overweight, you can expect to gain 15-25 lb (about 7-11 kg). You may begin to get stretch marks on your hips, abdomen, and breasts. Your breasts will continue to grow and may hurt. A yellow fluid (colostrum) may leak from your breasts. This is the first milk you are producing for your baby. You may have changes in your hair. These can include thickening of your hair, rapid growth, and changes in texture. Some people also have hair loss during or after pregnancy, or hair that feels dry or thin. Your belly button may stick out. You may notice more swelling in your hands, face, or ankles. Health changes You may have heartburn. You may have constipation. You may develop hemorrhoids. You may develop swollen, bulging veins (varicose veins) in your legs. You may have increased body aches in the pelvis, back, or thighs. This is due to weight gain and increased hormones that are relaxing your joints. You may have increased tingling or numbness in your hands, arms, and legs. The skin on your abdomen may also feel numb. You may feel short of breath because of your expanding uterus. Other  changes You may urinate more often because the fetus is moving lower into your pelvis and pressing on your bladder. You may have more problems sleeping. This may be caused by the size of your abdomen, an increased need to urinate, and an increase in your body's metabolism. You may notice the fetus "dropping," or moving lower in your abdomen (lightening). You may have increased vaginal discharge. You may notice that you have pain around your pelvic bone as your uterus distends. Follow these instructions at home: Medicines Follow your health care provider's instructions regarding medicine use. Specific medicines may be either safe or unsafe to take during pregnancy. Do not take any medicines unless approved by your health care provider. Take a prenatal vitamin that contains at least 600 micrograms (mcg) of folic acid. Eating and drinking Eat a healthy diet that includes fresh fruits and vegetables, whole grains, good sources of protein such as meat, eggs, or tofu, and low-fat dairy products. Avoid raw meat and unpasteurized juice, milk, and cheese. These carry germs that can harm you and your baby. Eat 4 or 5 small meals rather than 3 large meals a day. You may need to take these actions to prevent or treat constipation: Drink enough fluid to keep your urine pale yellow. Eat foods that are high in fiber, such as beans, whole grains, and fresh fruits and vegetables. Limit foods that are high in fat and processed sugars, such as fried or sweet foods. Activity Exercise only as directed by your health care provider. Most people can continue their usual exercise routine during pregnancy. Try  to exercise for 30 minutes at least 5 days a week. Stop exercising if you experience contractions in the uterus. Stop exercising if you develop pain or cramping in the lower abdomen or lower back. Avoid heavy lifting. Do not exercise if it is very hot or humid or if you are at a high altitude. If you choose to,  you may continue to have sex unless your health care provider tells you not to. Relieving pain and discomfort Take frequent breaks and rest with your legs raised (elevated) if you have leg cramps or low back pain. Take warm sitz baths to soothe any pain or discomfort caused by hemorrhoids. Use hemorrhoid cream if your health care provider approves. Wear a supportive bra to prevent discomfort from breast tenderness. If you develop varicose veins: Wear support hose as told by your health care provider. Elevate your feet for 15 minutes, 3-4 times a day. Limit salt in your diet. Safety Talk to your health care provider before traveling far distances. Do not use hot tubs, steam rooms, or saunas. Wear your seat belt at all times when driving or riding in a car. Talk with your health care provider if someone is verbally or physically abusive to you. Preparing for birth To prepare for the arrival of your baby: Take prenatal classes to understand, practice, and ask questions about labor and delivery. Visit the hospital and tour the maternity area. Purchase a rear-facing car seat and make sure you know how to install it in your car. Prepare the baby's room or sleeping area. Make sure to remove all pillows and stuffed animals from the baby's crib to prevent suffocation. General instructions Avoid cat litter boxes and soil used by cats. These carry germs that can cause birth defects in the baby. If you have a cat, ask someone to clean the litter box for you. Do not douche or use tampons. Do not use scented sanitary pads. Do not use any products that contain nicotine or tobacco, such as cigarettes, e-cigarettes, and chewing tobacco. If you need help quitting, ask your health care provider. Do not use any herbal remedies, illegal drugs, or medicines that were not prescribed to you. Chemicals in these products can harm your baby. Do not drink alcohol. You will have more frequent prenatal exams during the  third trimester. During a routine prenatal visit, your health care provider will do a physical exam, perform tests, and discuss your overall health. Keep all follow-up visits. This is important. Where to find more information American Pregnancy Association: americanpregnancy.[place] and Gynecologists: PoolDevices.com.pt Office on [employer]: KeywordPortfolios.com.br Contact a health care provider if you have: A fever. Mild pelvic cramps, pelvic pressure, or nagging pain in your abdominal area or lower back. Vomiting or diarrhea. Bad-smelling vaginal discharge or foul-smelling urine. Pain when you urinate. A headache that does not go away when you take medicine. Visual changes or see spots in front of your eyes. Get help right away if: Your water breaks. You have regular contractions less than 5 minutes apart. You have spotting or bleeding from your vagina. You have severe abdominal pain. You have difficulty breathing. You have chest pain. You have fainting spells. You have not felt your baby move for the time period told by your health care provider. You have new or increased pain, swelling, or redness in an arm or leg. Summary The third trimester of pregnancy is from week 28 through week 40 (months 7 through 9). You may have more problems  sleeping. This can be caused by the size of your abdomen, an increased need to urinate, and an increase in your body's metabolism. You will have more frequent prenatal exams during the third trimester. Keep all follow-up visits. This is important. This information is not intended to replace advice given to you by your health care provider. Make sure you discuss any questions you have with your healthcare provider. Document Revised: 01/04/2020 Document Reviewed: 11/10/2019 Elsevier Patient Education  2022 [employer]. Vaginal Delivery  Vaginal delivery means that you give birth by pushing your  baby out of your birth canal (vagina). Your health care team will help you before, during, and after vaginaldelivery. Birth experiences are unique for every woman and every pregnancy, and birthexperiences vary depending on where you choose to give birth. What are the risks and benefits? Generally, this is safe. However, problems may occur, including: Bleeding. Infection. Damage to other structures such as vaginal tearing. Allergic reactions to medicines. Despite the risks, benefits of vaginal delivery include less risk of bleeding and infection and a shorter recovery time compared to a Cesarean delivery.Cesarean delivery, or C-section, is the surgical delivery of a baby. What happens when I arrive at the birth center or hospital? Once you are in labor and have been admitted into the hospital or birth center, your health care team may: Review your pregnancy history and any concerns that you have. Talk with you about your birth plan and discuss pain control options. Check your blood pressure, breathing, and heartbeat. Assess your baby's heartbeat. Monitor your uterus for contractions. Check whether your bag of water (amniotic sac) has broken (ruptured). Insert an IV into one of your veins. This may be used to give you fluids and medicines. Monitoring Your health care team may assess your contractions (uterine monitoring) and your baby's heart rate (fetal monitoring). You may need to be monitored: Often, but not continuously (intermittently). All the time or for long periods at a time (continuously). Continuous monitoring may be needed if: You are taking certain medicines, such as medicine to relieve pain or make your contractions stronger. You have pregnancy or labor complications. Monitoring may be done by: Placing a special stethoscope or a handheld monitoring device on your abdomen to check your baby's heartbeat and to check for contractions. Placing monitors on your abdomen (external  monitors) to record your baby's heartbeat and the frequency and length of contractions. Placing monitors inside your uterus through your vagina (internal monitors) to record your baby's heartbeat and the frequency, length, and strength of your contractions. Depending on the type of monitor, it may remain in your uterus or on your baby's head until birth. Telemetry. This is a type of continuous monitoring that can be done with external or internal monitors. Instead of having to stay in bed, you are able to move around. Physical exam Your health care team may perform frequent physical exams. This may include: Checking how and where your baby is positioned in your uterus. Checking your cervix to determine: Whether it is thinning out (effacing). Whether it is opening up (dilating). What happens during labor and delivery?  Normal labor and delivery is divided into the following three stages: Stage 1 This is the longest stage of labor. Throughout this stage, you will feel contractions. Contractions generally feel mild, infrequent, and irregular at first. They get stronger, more frequent, and more regular as you move through this stage. You may have contractions about every 2-3 minutes. This stage ends when your cervix is completely  dilated to 4 inches (10 cm) and completely effaced. Stage 2 This stage starts once your cervix is completely effaced and dilated and lasts until the delivery of your baby. This is the stage where you will feel an urge to push your baby out of your vagina. You may feel stretching and burning pain, especially when the widest part of your baby's head passes through the vaginal opening (crowning). Once your baby is delivered, the umbilical cord will be clamped and cut. Timing of cutting the cord will depend on your wishes, your baby's health, and your health care provider's practices. Your baby will be placed on your bare chest (skin-to-skin contact) in an upright position and  covered with a warm blanket. If you are choosing to breastfeed, watch your baby for feeding cues, like rooting or sucking, and help the baby to your breast for his or her first feeding. Stage 3 This stage starts immediately after the birth of your baby and ends after you deliver the placenta. This stage may take anywhere from 5 to 30 minutes. After your baby has been delivered, you will feel contractions as your body expels the placenta. These contractions also help your uterus get smaller and reduce bleeding. What can I expect after labor and delivery? After labor is over, you and your baby will be assessed closely until you are ready to go home. Your health care team will teach you how to care for yourself and your baby. You and your baby may be encouraged to stay in the same room (rooming in) during your hospital stay. This will help promote early bonding and successful breastfeeding. Your uterus will be checked and massaged regularly (fundal massage). You may continue to receive fluids and medicines through an IV. You will have some soreness and pain in your abdomen, vagina, and the area of skin between your vaginal opening and your anus (perineum). If an incision was made near your vagina (episiotomy) or if you had some vaginal tearing during delivery, cold compresses may be placed on your episiotomy or your tear. This helps to reduce pain and swelling. It is normal to have vaginal bleeding after delivery. Wear a sanitary pad for vaginal bleeding and discharge. Summary Vaginal delivery means that you will give birth by pushing your baby out of your birth canal (vagina). Your health care team will monitor you and your baby throughout the stages of labor. After you deliver your baby, your health care team will continue to assess you and your baby to ensure you are both recovering as expected after delivery. This information is not intended to replace advice given to you by your health care  provider. Make sure you discuss any questions you have with your healthcare provider. Document Revised: 06/25/2020 Document Reviewed: 06/25/2020 Elsevier Patient Education  2022 [employer].

## 2021-02-20 NOTE — Telephone Encounter (Signed)
Pt calling; was seen this am; swabs done and cx ck'd; now bleeding with wiping. Is about 37wks.  [phone]  Adv pt this is normal and it's b/c the swabs were done and her cx ck'd; should stop within 24hrs; to let us know if bleeds like a period.

## 2021-02-20 NOTE — Progress Notes (Signed)
Routine Prenatal Care Visit  Subjective  Tracie Meyer is a 30 y.o. [phone] at [redacted]w[redacted]d being seen today for ongoing prenatal care.  She is currently monitored for the following issues for this low-risk pregnancy and has Encounter for supervision of other normal pregnancy, second trimester on their problem list.  ----------------------------------------------------------------------------------- Patient reports no complaints.  She is feeling occasional contractions. Contractions: Not present. Vag. Bleeding: None.  Movement: Present. Denies leaking of fluid.  ----------------------------------------------------------------------------------- The following portions of the patient's history were reviewed and updated as appropriate: allergies, current medications, past family history, past medical history, past social history, past surgical history and problem list. Problem list updated.   Objective  Blood pressure 116/70, height 5\' 7"  (1.702 m), weight 155 lb 6.4 oz (70.5 kg), last menstrual period 06/08/2020. Pregravid weight 110 lb (49.9 kg) Total Weight Gain 45 lb 6.4 oz (20.6 kg) Urinalysis:      Fetal Status: Fetal Heart Rate (bpm): 155 Fundal Height: 35 cm Movement: Present  Presentation: Vertex  General:  Alert, oriented and cooperative. Patient is in no acute distress.  Skin: Skin is warm and dry. No rash noted.   Cardiovascular: Normal heart rate noted  Respiratory: Normal respiratory effort, no problems with respiration noted  Abdomen: Soft, gravid, appropriate for gestational age. Pain/Pressure: Present     Pelvic:  Cervical exam performed Dilation: 1 Effacement (%): 20 Station: -3  Extremities: Normal range of motion.  Edema: None  Mental Status: Normal mood and affect. Normal behavior. Normal judgment and thought content.     Assessment   30 y.o. 26 at [redacted]w[redacted]d by  03/15/2021, Date entered prior to episode creation presenting for routine prenatal visit  Plan    pregnancy 3  Problems (from 10/04/20 to present)     Problem Noted Resolved   Encounter for supervision of other normal pregnancy, second trimester 10/04/2020 by 10/06/2020, CNM No   Overview Addendum 02/05/2021 10:20 AM by 02/07/2021, MD     Nursing Staff Provider  Office Location  [facility] Dating   LMP  Language  English Anatomy Natale Milch  complete  Flu Vaccine   declined Genetic Screen  NIPS: Diploid XX  TDaP vaccine   01/11/2021 Hgb A1C or  GTT Third trimester : 26  Rhogam   n/a   LAB RESULTS   Feeding Plan  formula Blood Type O/Positive/-- (02/24 1018)   Contraception  Depo Provera Antibody Negative (02/24 1018) negative   Circumcision  Rubella  immune  Pediatrician   RPR Non Reactive (02/24 1018) non-reactive  Support Person  12-16-1994 - boyfriend HBsAg Negative (02/24 1018) negative  Prenatal Classes  declined HIV Non Reactive (02/24 1018) non-reactive    Varicella   immune  BTL Consent  n/a GBS  (For PCN allergy, check sensitivities)        VBAC Consent  n/a Pap  10/04/20 - NILM    Hgb Electro   n/a   Covid  unvaccinated - encouraged at [facility]      SMA        Anemia in pregnancy- starting oral iron 12/26/2020            Gestational age appropriate obstetric precautions including but not limited to vaginal bleeding, contractions, leaking of fluid and fetal movement were reviewed in detail with the patient.    Return in about 1 week (around 02/27/2021) for ROB in person.  03/01/2021 MD [facility], [facility] 02/20/2021, 10:34 AM

## 2021-02-21 LAB — CERVICOVAGINAL ANCILLARY ONLY
Chlamydia: NEGATIVE
Comment: NEGATIVE
Comment: NEGATIVE
Comment: NORMAL
Neisseria Gonorrhea: NEGATIVE
Trichomonas: NEGATIVE

## 2021-02-23 LAB — CULTURE, BETA STREP (GROUP B ONLY): Strep Gp B Culture: POSITIVE — AB

## 2021-02-25 ENCOUNTER — Ambulatory Visit (INDEPENDENT_AMBULATORY_CARE_PROVIDER_SITE_OTHER): Payer: Medicaid Other | Admitting: Obstetrics

## 2021-02-25 ENCOUNTER — Other Ambulatory Visit: Payer: Self-pay

## 2021-02-25 VITALS — BP 100/60 | Wt 156.0 lb

## 2021-02-25 DIAGNOSIS — Z3483 Encounter for supervision of other normal pregnancy, third trimester: Secondary | ICD-10-CM

## 2021-02-25 DIAGNOSIS — Z3A37 37 weeks gestation of pregnancy: Secondary | ICD-10-CM

## 2021-02-25 LAB — POCT URINALYSIS DIPSTICK OB
Glucose, UA: NEGATIVE
POC,PROTEIN,UA: NEGATIVE

## 2021-02-25 NOTE — Progress Notes (Signed)
ROB - request cervical check. RM 5

## 2021-02-25 NOTE — Progress Notes (Signed)
  Routine Prenatal Care Visit  Subjective  Tracie Meyer is a 30 y.o. [phone] at [redacted]w[redacted]d being seen today for ongoing prenatal care.  She is currently monitored for the following issues for this low-risk pregnancy and has Encounter for supervision of other normal pregnancy, second trimester on their problem list.  ----------------------------------------------------------------------------------- Patient reports no complaints.    .  .   Tracie Meyer Fluid denies.  ----------------------------------------------------------------------------------- The following portions of the patient's history were reviewed and updated as appropriate: allergies, current medications, past family history, past medical history, past social history, past surgical history and problem list. Problem list updated.  Objective  Last menstrual period 06/08/2020. Pregravid weight 110 lb (49.9 kg) Total Weight Gain 45 lb 6.4 oz (20.6 kg) Urinalysis: Urine Protein    Urine Glucose    Fetal Status:           General:  Alert, oriented and cooperative. Patient is in no acute distress.  Skin: Skin is warm and dry. No rash noted.   Cardiovascular: Normal heart rate noted  Respiratory: Normal respiratory effort, no problems with respiration noted  Abdomen: Soft, gravid, appropriate for gestational age.       Pelvic:  Cervical exam performed      3 cms/thick/-3  Extremities: Normal range of motion.     Mental Status: Normal mood and affect. Normal behavior. Normal judgment and thought content.   Assessment   30 y.o. [id number] at [redacted]w[redacted]d by  03/15/2021, Date entered prior to episode creation presenting for routine prenatal visit  Plan   pregnancy 3  Problems (from 10/04/20 to present)     Problem Noted Resolved   Encounter for supervision of other normal pregnancy, second trimester 10/04/2020 by Tracie Meyer, CNM No   Overview Addendum 02/05/2021 10:20 AM by Tracie Milch, MD     Nursing Staff Provider  Office  Location  [facility] Dating   LMP  Language  English Anatomy US  complete  Flu Vaccine   declined Genetic Screen  NIPS: Diploid XX  TDaP vaccine   01/11/2021 Hgb A1C or  GTT Third trimester : 74  Rhogam   n/a   LAB RESULTS   Feeding Plan  formula Blood Type O/Positive/-- (02/24 1018)   Contraception  Depo Provera Antibody Negative (02/24 1018) negative   Circumcision  Rubella  immune  Pediatrician   RPR Non Reactive (02/24 1018) non-reactive  Support Person  Tracie Meyer - boyfriend HBsAg Negative (02/24 1018) negative  Prenatal Classes  declined HIV Non Reactive (02/24 1018) non-reactive    Varicella   immune  BTL Consent  n/a GBS  (For PCN allergy, check sensitivities)        VBAC Consent  n/a Pap  10/04/20 - NILM    Hgb Electro   n/a   Covid  unvaccinated - encouraged at [facility]      SMA         Anemia in pregnancy- starting oral iron 12/26/2020           Term labor symptoms and general obstetric precautions including but not limited to vaginal bleeding, contractions, leaking of fluid and fetal movement were reviewed in detail with the patient. Please refer to After Visit Summary for other counseling recommendations.   Return in about 1 week (around 03/04/2021) for return OB.  Tracie Meyer, CNM  02/25/2021 10:07 AM

## 2021-02-28 ENCOUNTER — Encounter: Payer: Self-pay | Admitting: Obstetrics and Gynecology

## 2021-02-28 ENCOUNTER — Other Ambulatory Visit: Payer: Self-pay

## 2021-02-28 ENCOUNTER — Inpatient Hospital Stay
Admission: EM | Admit: 2021-02-28 | Discharge: 2021-02-28 | Disposition: A | Discharge status: Home / Self Care | Payer: Medicaid Other | Attending: Obstetrics and Gynecology | Admitting: Obstetrics and Gynecology

## 2021-02-28 DIAGNOSIS — O471 False labor at or after 37 completed weeks of gestation: Secondary | ICD-10-CM | POA: Insufficient documentation

## 2021-02-28 DIAGNOSIS — O0001 Abdominal pregnancy with intrauterine pregnancy: Secondary | ICD-10-CM | POA: Diagnosis not present

## 2021-02-28 DIAGNOSIS — Z3A37 37 weeks gestation of pregnancy: Secondary | ICD-10-CM | POA: Diagnosis not present

## 2021-02-28 DIAGNOSIS — Z87891 Personal history of nicotine dependence: Secondary | ICD-10-CM | POA: Insufficient documentation

## 2021-02-28 DIAGNOSIS — Z3482 Encounter for supervision of other normal pregnancy, second trimester: Secondary | ICD-10-CM

## 2021-02-28 NOTE — Discharge Summary (Signed)
See Final progress note 

## 2021-02-28 NOTE — Final Progress Note (Signed)
Physician Final Progress Note  Patient ID: Tracie Meyer MRN: [id number] DOB/AGE: 05-Jan-1991 30 y.o.  Admit date: 02/28/2021 Admitting provider: Conard Novak, MD Discharge date: 02/28/2021   Admission Diagnoses:  1) intrauterine pregnancy at [redacted]w[redacted]d  2) abdominal pain (regular uterine contractions)  Discharge Diagnoses:  1) intrauterine pregnancy at [redacted]w[redacted]d  2) abdominal pain (regular uterine contractions) - false labor  History of Present Illness: The patient is a 30 y.o. female [phone] at [redacted]w[redacted]d who presents for regular uterine contractions, coming about every 8 minutes.  She notes +FM , no LOF, no vaginal bleeding.   Past Medical History:  Diagnosis Date   History of fainting spells of unknown cause     Past Surgical History:  Procedure Laterality Date   APPENDECTOMY  2013    No current facility-administered medications on file prior to encounter.   Current Outpatient Medications on File Prior to Encounter  Medication Sig Dispense Refill   Prenatal Vit-Fe Fumarate-FA (MULTIVITAMIN-PRENATAL) 27-0.8 MG TABS tablet Take 1 tablet by mouth daily at 12 noon.      Allergies  Allergen Reactions   Morphine And Related     Immediate vein hypersensitivity, swelling, redness and rash.    No Known Allergies Other (See Comments)    Social History   Socioeconomic History   Marital status: Single    Spouse name: Not on file   Number of children: 2   Years of education: Not on file   Highest education level: Not on file  Occupational History   Not on file  Tobacco Use   Smoking status: Former    Packs/day: 0.50    Types: Cigarettes   Smokeless tobacco: Never  Vaping Use   Vaping Use: Every day  Substance and Sexual Activity   Alcohol use: No    Comment: Social    Drug use: No   Sexual activity: Not Currently    Birth control/protection: Pill  Other Topics Concern   Not on file  Social History Narrative   Not on file   Social Determinants of Health   Financial  Resource Strain: Not on file  Food Insecurity: Not on file  Transportation Needs: Not on file  Physical Activity: Not on file  Stress: Not on file  Social Connections: Not on file  Intimate Partner Violence: Not At Risk   Fear of Current or Ex-Partner: No   Emotionally Abused: No   Physically Abused: No   Sexually Abused: No    Family History  Problem Relation Age of Onset   Healthy Mother    Testicular cancer Father    Cancer Maternal Grandmother        pancreatic   Depression Maternal Grandfather      Review of Systems  Constitutional: Negative.   HENT: Negative.    Eyes: Negative.   Respiratory: Negative.    Cardiovascular: Negative.   Gastrointestinal:  Positive for abdominal pain (contractions). Negative for blood in stool, constipation, diarrhea, heartburn, melena, nausea and vomiting.  Genitourinary: Negative.   Musculoskeletal: Negative.   Skin: Negative.   Neurological: Negative.   Psychiatric/Behavioral: Negative.      Physical Exam: BP 115/63 (BP Location: Left Arm)   Pulse 79   Temp 97.9 F (36.6 C) (Oral)   Resp 19   Ht 5\' 7"  (1.702 m)   Wt 70.8 kg   LMP 06/08/2020 (Approximate)   BMI 24.43 kg/m   Physical Exam Constitutional:      General: She is not in acute distress.  Appearance: Normal appearance.  Genitourinary:     Genitourinary Comments: 3 cm unchanged over 2 hours per RN  HENT:     Head: Normocephalic and atraumatic.  Eyes:     General: No scleral icterus.    Conjunctiva/sclera: Conjunctivae normal.  Neurological:     General: No focal deficit present.     Mental Status: She is alert and oriented to person, place, and time.     Cranial Nerves: No cranial nerve deficit.  Psychiatric:        Mood and Affect: Mood normal.        Behavior: Behavior normal.        Judgment: Judgment normal.    Consults: None  Significant Findings/ Diagnostic Studies: none  Procedures:  NST: Baseline FHR: 135 beats/min Variability:  moderate Accelerations: present Decelerations: absent Tocometry: infrequent  Interpretation:  INDICATIONS: rule out uterine contractions RESULTS:  A NST procedure was performed with FHR monitoring and a normal baseline established, appropriate time of 20-40 minutes of evaluation, and accels >2 seen w 15x15 characteristics.  Results show a REACTIVE NST.    Hospital Course: The patient was admitted to Labor and Delivery Triage for observation. She had normal vital signs.  The fetal tracing was category 1 and she had a reactive NST.  She had a cervical exam that was unchanged from her exam in clinic this week. She was rechecked after 2 hours and her exam was still unchanged .  Discharge Condition: stable  Disposition: Discharge disposition: 01-Home or Self Care       Diet: Regular diet  Discharge Activity: Activity as tolerated   Allergies as of 02/28/2021       Reactions   Morphine And Related    Immediate vein hypersensitivity, swelling, redness and rash.    No Known Allergies Other (See Comments)        Medication List     TAKE these medications    multivitamin-prenatal 27-0.8 MG Tabs tablet Take 1 tablet by mouth daily at 12 noon.         Total time spent taking care of this patient: 25 minutes  Signed: Thomasene Mohair, MD  02/28/2021, 3:47 PM

## 2021-02-28 NOTE — OB Triage Note (Signed)
Patient presented to L&D for evaluation after contractions started at 0900 this morning have have progressively gotten worse.  Patient denies vaginal bleeding or leaking fluid and reports normal fetal movement.

## 2021-02-28 NOTE — Discharge Summary (Signed)
Rn reviewed discharge instructions and went over the red flag warning signs of labor. RN gave pt opportunity for questions. All questions answered at this time. Pt verbalized understanding. Pt discharged home

## 2021-03-05 ENCOUNTER — Ambulatory Visit (INDEPENDENT_AMBULATORY_CARE_PROVIDER_SITE_OTHER): Payer: Medicaid Other | Admitting: Obstetrics & Gynecology

## 2021-03-05 ENCOUNTER — Other Ambulatory Visit: Payer: Self-pay

## 2021-03-05 ENCOUNTER — Encounter: Payer: Self-pay | Admitting: Obstetrics & Gynecology

## 2021-03-05 VITALS — BP 100/60 | Wt 157.0 lb

## 2021-03-05 DIAGNOSIS — Z3482 Encounter for supervision of other normal pregnancy, second trimester: Secondary | ICD-10-CM

## 2021-03-05 DIAGNOSIS — Z3A38 38 weeks gestation of pregnancy: Secondary | ICD-10-CM

## 2021-03-05 LAB — POCT URINALYSIS DIPSTICK OB
Glucose, UA: NEGATIVE
POC,PROTEIN,UA: NEGATIVE

## 2021-03-05 NOTE — Addendum Note (Signed)
Addended by: Cornelius Moras D on: 03/05/2021 09:43 AM   Modules accepted: Orders

## 2021-03-05 NOTE — Patient Instructions (Signed)
First Stage of Labor Labor is your body's natural process of moving your baby and other structures, including the placenta and umbilical cord, out of your uterus. There are three stages of labor. How long each stage lasts is different for every woman. But certain events happen during each stage that are the same for everyone. The first stage starts when true labor begins. This stage ends when your cervix, which is the opening from your uterus into your vagina, is completely open (dilated). The second stage begins when your cervix is fully dilated and you start pushing. This stage ends when your baby is born. The third stage is the delivery of the organ that nourished your baby during pregnancy (placenta). First stage of labor As your due date gets closer, you may start to notice certain physical changes that mean labor is going to start soon. You may feel that your baby has dropped lower into your pelvis. You may experience irregular, often painless, contractions that go away when you walk around or lie down ([employer] contractions). This is also called false labor. The first stage of labor begins when you start having contractions that come at regular (evenly spaced) intervals and your cervix starts to get thinner and wider in preparation for your baby to pass through. Birth care providers measure the dilation of your cervix in centimeters (cm). One centimeter is a little less than one-half of an inch. The first stage ends when your cervix is dilated to 10 cm. The first stage of labor is divided into three phases: Early phase. Active phase. Transitional phase. The length of the first stage of labor varies. It may be longer if this is your first pregnancy. You may spend most of this stage at home trying to relax andstay comfortable. How does this affect me? During the first stage of labor, you will move through three phases. What happens in the early phase? You will start to have regular  contractions that last 30-60 seconds. Contractions may come every 5-20 minutes. Keep track of your contractions and call your birth care provider. Your water may break during this phase. You may notice a clear or slightly bloody discharge of mucus (mucus plug) from your vagina. Your cervix will dilate to 3-6 cm. What happens in the active phase? The active phase usually lasts 3-5 hours. You may go to the hospital or birth center around this time. During the active phase: Your contractions will become stronger, longer, and more uncomfortable. Your contractions may last 45-90 seconds and come every 3-5 minutes. You may feel lower back pain. Your birth care providers may examine your cervix and feel your belly to find the position of your baby. You may have a monitor strapped to your belly to measure your contractions and your baby's heart rate. You may start using your pain management options. Your cervix may be dilated to 6 cm and may start to dilate more quickly. What happens in the transitional phase? The transitional phase typically lasts from 30 minutes to 2 hours. At the end of this phase, your cervix will be fully dilated to 10 cm. During the transitional phase: Contractions will get stronger and longer. Contractions may last 60-90 seconds and come less than 2 minutes apart. You may feel hot flashes, chills, or nausea. How does this affect my baby? During the first stage of labor, your baby will gradually move down into yourbirth canal. Follow these instructions at home and in the hospital or birth center:  When labor  When labor first begins, try to stay calm. You are still in the early phase. If it is night, try to get some sleep. If it is day, try to relax and save your energy. You may want to make some calls and get ready to go to the hospital or birth center. When you are in the early phase, try these methods to help ease discomfort: Deep breathing and muscle relaxation. Taking a  walk. Taking a warm bath or shower. Drink some fluids and have a light snack if you feel like it. Keep track of your contractions. Based on the plan you created with your birth care provider, call when your contractions indicate it is time. If your water breaks, note the time, color, and odor of the fluid. When you are in the active phase, do your breathing exercises and rely on your support people and your team of birth care providers. Contact a health care provider if: Your contractions are strong and regular. You have lower back pain or cramping. Your water breaks. You lose your mucus plug. Get help right away if you: Have a severe headache that does not go away. Have changes in your vision. Have severe pain in your upper belly. Do not feel the baby move. Have bright red bleeding. Summary The first stage of labor starts when true labor begins, and it ends when your cervix is dilated to 10 cm. The first stage of labor has three phases: early, active, and transitional. Your baby moves into the birth canal during the first stage of labor. You may have contractions that become stronger and longer. You may also lose your mucus plug and have your water break. Call your birth care provider when your contractions are frequent and strong enough to go to the hospital or birth center. This information is not intended to replace advice given to you by your health care provider. Make sure you discuss any questions you have with your health care provider. Document Revised: 11/18/2018 Document Reviewed: 10/11/2017 Elsevier Patient Education  2022 Elsevier Inc.  

## 2021-03-05 NOTE — Progress Notes (Signed)
  Subjective  Fetal Movement? yes Contractions? no Leaking Fluid? no Vaginal Bleeding? no No ctxs since [facility] visit 7/21 Objective  BP 100/60   Wt 157 lb (71.2 kg)   LMP 06/08/2020 (Approximate)   BMI 24.59 kg/m  General: NAD Pumonary: no increased work of breathing Abdomen: gravid, non-tender Extremities: no edema Psychiatric: mood appropriate, affect full SVE 3/60/-3, posterior, VTX Assessment  30 y.o. [id number] at [redacted]w[redacted]d by  03/15/2021, Date entered prior to episode creation presenting for routine prenatal visit  Plan   Problem List Items Addressed This Visit      Other   Encounter for supervision of other normal pregnancy, second trimester - Primary  Other Visit Diagnoses    [redacted] weeks gestation of pregnancy        PNV, [facility] Discussed IOL; not ready to sch yet, awaiting natural labor  pregnancy 3  Problems (from 10/04/20 to present)    Problem     Encounter for supervision of other normal pregnancy, second trimester     Overview Addendum 02/05/2021 10:20 AM by Natale Milch, MD     Nursing Staff Provider  Office Location  [facility] Dating   LMP  Language  English Anatomy US  complete  Flu Vaccine   declined Genetic Screen  NIPS: Diploid XX  TDaP vaccine   01/11/2021 Hgb A1C or  GTT Third trimester : 65  Rhogam   n/a   LAB RESULTS   Feeding Plan  formula Blood Type O/Positive/-- (02/24 1018)   Contraception  Depo Provera Antibody Negative (02/24 1018) negative   Circumcision  Rubella  immune  Pediatrician   RPR Non Reactive (02/24 1018) non-reactive  Support Person  Thurston Pounds - boyfriend HBsAg Negative (02/24 1018) negative  Prenatal Classes  declined HIV Non Reactive (02/24 1018) non-reactive    Varicella   immune  BTL Consent  n/a GBS  (For PCN allergy, check sensitivities)        VBAC Consent  n/a Pap  10/04/20 - NILM    Hgb Electro   n/a   Covid  unvaccinated - encouraged at [facility]      SMA         Anemia in pregnancy- starting oral iron 12/26/2020         Annamarie Major, MD, [facility], [facility] 03/05/2021  9:41 AM

## 2021-03-12 ENCOUNTER — Encounter: Payer: Self-pay | Admitting: Obstetrics and Gynecology

## 2021-03-12 ENCOUNTER — Inpatient Hospital Stay
Admission: RE | Admit: 2021-03-12 | Discharge: 2021-03-14 | DRG: 807 | Disposition: A | Discharge status: Home / Self Care | Payer: Medicaid Other | Attending: Obstetrics and Gynecology | Admitting: Obstetrics and Gynecology

## 2021-03-12 ENCOUNTER — Other Ambulatory Visit: Payer: Self-pay

## 2021-03-12 ENCOUNTER — Ambulatory Visit (INDEPENDENT_AMBULATORY_CARE_PROVIDER_SITE_OTHER): Payer: Medicaid Other | Admitting: Obstetrics and Gynecology

## 2021-03-12 VITALS — BP 120/72 | Ht 67.0 in | Wt 159.4 lb

## 2021-03-12 DIAGNOSIS — Z20822 Contact with and (suspected) exposure to covid-19: Secondary | ICD-10-CM | POA: Diagnosis present

## 2021-03-12 DIAGNOSIS — Z3482 Encounter for supervision of other normal pregnancy, second trimester: Secondary | ICD-10-CM

## 2021-03-12 DIAGNOSIS — Z3490 Encounter for supervision of normal pregnancy, unspecified, unspecified trimester: Secondary | ICD-10-CM | POA: Diagnosis present

## 2021-03-12 DIAGNOSIS — Z3A39 39 weeks gestation of pregnancy: Secondary | ICD-10-CM

## 2021-03-12 DIAGNOSIS — O9902 Anemia complicating childbirth: Principal | ICD-10-CM | POA: Diagnosis present

## 2021-03-12 DIAGNOSIS — Z3493 Encounter for supervision of normal pregnancy, unspecified, third trimester: Secondary | ICD-10-CM

## 2021-03-12 DIAGNOSIS — Z880 Allergy status to penicillin: Secondary | ICD-10-CM

## 2021-03-12 DIAGNOSIS — O26893 Other specified pregnancy related conditions, third trimester: Secondary | ICD-10-CM | POA: Diagnosis present

## 2021-03-12 LAB — POCT URINALYSIS DIPSTICK OB
Glucose, UA: NEGATIVE
POC,PROTEIN,UA: NEGATIVE

## 2021-03-12 LAB — RESP PANEL BY RT-PCR (FLU A&B, COVID) ARPGX2
Influenza A by PCR: NEGATIVE
Influenza B by PCR: NEGATIVE
SARS Coronavirus 2 by RT PCR: NEGATIVE

## 2021-03-12 LAB — CBC
HCT: 30.8 % — ABNORMAL LOW (ref 36.0–46.0)
Hemoglobin: 10.1 g/dL — ABNORMAL LOW (ref 12.0–15.0)
MCH: 29.5 pg (ref 26.0–34.0)
MCHC: 32.8 g/dL (ref 30.0–36.0)
MCV: 90.1 fL (ref 80.0–100.0)
Platelets: 160 10*3/uL (ref 150–400)
RBC: 3.42 MIL/uL — ABNORMAL LOW (ref 3.87–5.11)
RDW: 13.6 % (ref 11.5–15.5)
WBC: 12.9 10*3/uL — ABNORMAL HIGH (ref 4.0–10.5)
nRBC: 0 % (ref 0.0–0.2)

## 2021-03-12 LAB — TYPE AND SCREEN
ABO/RH(D): O POS
Antibody Screen: NEGATIVE

## 2021-03-12 LAB — RUPTURE OF MEMBRANE (ROM)PLUS: Rom Plus: NEGATIVE

## 2021-03-12 MED ORDER — MISOPROSTOL 25 MCG QUARTER TABLET
25.0000 ug | ORAL_TABLET | ORAL | Status: DC | PRN
  Filled 2021-03-12: qty 1

## 2021-03-12 MED ORDER — OXYTOCIN BOLUS FROM INFUSION: 333.0000 mL | Freq: Once | INTRAVENOUS | Status: DC

## 2021-03-12 MED ORDER — TERBUTALINE SULFATE 1 MG/ML IJ SOLN: 0.2500 mg | Freq: Once | INTRAMUSCULAR | Status: DC | PRN

## 2021-03-12 MED ORDER — ACETAMINOPHEN 325 MG PO TABS: 650.0000 mg | ORAL_TABLET | ORAL | Status: DC | PRN

## 2021-03-12 MED ORDER — LACTATED RINGERS IV SOLN: 500.0000 mL | INTRAVENOUS | Status: DC | PRN

## 2021-03-12 MED ORDER — OXYTOCIN-SODIUM CHLORIDE 30-0.9 UT/500ML-% IV SOLN
2.5000 [IU]/h | INTRAVENOUS | Status: DC
  Administered 2021-03-13: 2.5 [IU]/h via INTRAVENOUS
  Filled 2021-03-12 (×2): qty 500

## 2021-03-12 MED ORDER — SODIUM CHLORIDE 0.9 % IV SOLN
5.0000 10*6.[IU] | Freq: Once | INTRAVENOUS | Status: AC
  Administered 2021-03-12: 5 10*6.[IU] via INTRAVENOUS
  Filled 2021-03-12: qty 5

## 2021-03-12 MED ORDER — PENICILLIN G POT IN DEXTROSE 60000 UNIT/ML IV SOLN
3.0000 10*6.[IU] | INTRAVENOUS | Status: DC
  Administered 2021-03-13: 3 10*6.[IU] via INTRAVENOUS
  Filled 2021-03-12 (×5): qty 50

## 2021-03-12 MED ORDER — LACTATED RINGERS IV SOLN: INTRAVENOUS | Status: DC

## 2021-03-12 MED ORDER — LIDOCAINE HCL (PF) 1 % IJ SOLN
30.0000 mL | INTRAMUSCULAR | Status: DC | PRN
  Filled 2021-03-12: qty 30

## 2021-03-12 MED ORDER — OXYTOCIN-SODIUM CHLORIDE 30-0.9 UT/500ML-% IV SOLN: 1.0000 m[IU]/min | INTRAVENOUS | Status: DC

## 2021-03-12 MED ORDER — ONDANSETRON HCL 4 MG/2ML IJ SOLN: 4.0000 mg | Freq: Four times a day (QID) | INTRAMUSCULAR | Status: DC | PRN

## 2021-03-12 MED ORDER — SOD CITRATE-CITRIC ACID 500-334 MG/5ML PO SOLN: 30.0000 mL | ORAL | Status: DC | PRN

## 2021-03-12 NOTE — H&P (Signed)
Obstetric H&P   Chief Complaint: Contractions  Prenatal Care Provider: [facility]  History of Present Illness: 30 y.o. [id number] [redacted]w[redacted]d by 03/15/2021, Date entered prior to episode creation presenting to L&D with contractions.  Cervical change from 4.6 to 5cm with painful uterine contractions.  +FM, no LOF, no VB.  Pregnancy uncomplicated to date.    Pregravid weight 49.9 kg Total Weight Gain 22.4 kg  pregnancy 3  Problems (from 10/04/20 to present)     Problem Noted Resolved   Encounter for supervision of other normal pregnancy, second trimester 10/04/2020 by Zipporah Plants, CNM No   Overview Addendum 02/05/2021 10:20 AM by Natale Milch, MD     Nursing Staff Provider  Office Location  [facility] Dating   LMP  Language  English Anatomy US  complete  Flu Vaccine   declined Genetic Screen  NIPS: Diploid XX  TDaP vaccine   01/11/2021 Hgb A1C or  GTT Third trimester : 46  Rhogam   n/a   LAB RESULTS   Feeding Plan  formula Blood Type O/Positive/-- (02/24 1018)   Contraception  Depo Provera Antibody Negative (02/24 1018) negative   Circumcision  Rubella  immune  Pediatrician   RPR Non Reactive (02/24 1018) non-reactive  Support Person  Thurston Pounds - boyfriend HBsAg Negative (02/24 1018) negative  Prenatal Classes  declined HIV Non Reactive (02/24 1018) non-reactive    Varicella   immune  BTL Consent  n/a GBS  (For PCN allergy, check sensitivities)        VBAC Consent  n/a Pap  10/04/20 - NILM    Hgb Electro   n/a   Covid  unvaccinated - encouraged at [facility]      SMA        Anemia in pregnancy- starting oral iron 12/26/2020           Review of Systems: 10 point review of systems negative unless otherwise noted in HPI  Past Medical History: Patient Active Problem List   Diagnosis Date Noted   Labor and delivery indication for care or intervention 03/12/2021   Encounter for elective induction of labor 03/12/2021   [redacted] weeks gestation of pregnancy 02/28/2021   Encounter for supervision of  other normal pregnancy, second trimester 10/04/2020     Nursing Staff Provider  Office Location  [facility] Dating   LMP  Language  English Anatomy US  complete  Flu Vaccine   declined Genetic Screen  NIPS: Diploid XX  TDaP vaccine   01/11/2021 Hgb A1C or  GTT Third trimester : 63  Rhogam   n/a   LAB RESULTS   Feeding Plan  formula Blood Type O/Positive/-- (02/24 1018)   Contraception  Depo Provera Antibody Negative (02/24 1018) negative   Circumcision  Rubella  immune  Pediatrician   RPR Non Reactive (02/24 1018) non-reactive  Support Person  Thurston Pounds - boyfriend HBsAg Negative (02/24 1018) negative  Prenatal Classes  declined HIV Non Reactive (02/24 1018) non-reactive    Varicella   immune  BTL Consent  n/a GBS  (For PCN allergy, check sensitivities)        VBAC Consent  n/a Pap  10/04/20 - NILM    Hgb Electro   n/a   Covid  unvaccinated - encouraged at [facility]      SMA        Anemia in pregnancy- starting oral iron 12/26/2020     Past Surgical History: Past Surgical History:  Procedure Laterality Date   APPENDECTOMY  2013    Past Obstetric History: # 1 - Date: None, Sex: None, Weight: None, GA: None, Delivery: None, Apgar1: None, Apgar5: None, Living: None, Birth Comments: None  # 2 - Date: 05/26/12, Sex: Female, Weight: 3062 g, GA: None, Delivery: Vaginal, Spontaneous, Apgar1: None, Apgar5: None, Living: Living, Birth Comments: None  # 3 - Date: 05/29/16, Sex: Female, Weight: 3640 g, GA: [redacted]w[redacted]d, Delivery: Vaginal, Spontaneous, Apgar1: 9, Apgar5: 9, Living: Living, Birth Comments: None  # 4 - Date: None, Sex: None, Weight: None, GA: None, Delivery: None, Apgar1: None, Apgar5: None, Living: None, Birth Comments: None   Past Gynecologic History:  Family History: Family History  Problem Relation Age of Onset   Healthy Mother    Testicular cancer Father    Cancer Maternal Grandmother        pancreatic   Depression Maternal Grandfather     Social History: Social History    Socioeconomic History   Marital status: Single    Spouse name: Not on file   Number of children: 2   Years of education: Not on file   Highest education level: Not on file  Occupational History   Not on file  Tobacco Use   Smoking status: Former    Packs/day: 0.50    Types: Cigarettes   Smokeless tobacco: Never  Vaping Use   Vaping Use: Every day  Substance and Sexual Activity   Alcohol use: No    Comment: Social    Drug use: No   Sexual activity: Not Currently    Birth control/protection: Pill  Other Topics Concern   Not on file  Social History Narrative   Not on file   Social Determinants of Health   Financial Resource Strain: Not on file  Food Insecurity: Not on file  Transportation Needs: Not on file  Physical Activity: Not on file  Stress: Not on file  Social Connections: Not on file  Intimate Partner Violence: Not At Risk   Fear of Current or Ex-Partner: No   Emotionally Abused: No   Physically Abused: No   Sexually Abused: No    Medications: Prior to Admission medications   Medication Sig Start Date End Date Taking? Authorizing Provider  Prenatal Vit-Fe Fumarate-FA (MULTIVITAMIN-PRENATAL) 27-0.8 MG TABS tablet Take 1 tablet by mouth daily at 12 noon.    [provider]    Allergies: Allergies  Allergen Reactions   Morphine And Related     Immediate vein hypersensitivity, swelling, redness and rash.    No Known Allergies Other (See Comments)    Physical Exam: Vitals: Blood pressure 112/67, pulse 80, temperature 98.1 F (36.7 C), temperature source Oral, resp. rate 18, height 5\' 7"  (1.702 m), weight 72.3 kg, last menstrual period 06/08/2020.  Urine Dip Protein: N/A  FHT: 150, modetate, +accels, no decels Toco: q64min  General: NAD HEENT: normocephalic, anicteric Pulmonary: No increased work of breathing Cardiovascular: RRR, distal pulses 2+ Abdomen: Gravid, non-tender Leopolds: vtx Genitourinary:  Dilation: 5 Effacement (%):  80 Cervical Position: Posterior Station: -2 Presentation: Vertex Exam by:: JDaley  Extremities: no edema, erythema, or tenderness Neurologic: Grossly intact Psychiatric: mood appropriate, affect full  Labs: Results for orders placed or performed during the hospital encounter of 03/12/21 (from the past 24 hour(s))  ROM Plus ([facility] only)     Status: None   Collection Time: 03/12/21  6:56 PM  Result Value Ref Range   Rom Plus NEGATIVE   CBC     Status: Abnormal   Collection Time: 03/12/21  7:17 PM  Result Value Ref Range   WBC 12.9 (H) 4.0 - 10.5 K/uL   RBC 3.42 (L) 3.87 - 5.11 MIL/uL   Hemoglobin 10.1 (L) 12.0 - 15.0 g/dL   HCT 71.2 (L) 45.8 - 09.9 %   MCV 90.1 80.0 - 100.0 fL   MCH 29.5 26.0 - 34.0 pg   MCHC 32.8 30.0 - 36.0 g/dL   RDW 83.3 82.5 - 05.3 %   Platelets 160 150 - 400 K/uL   nRBC 0.0 0.0 - 0.2 %  Type and screen     Status: None   Collection Time: 03/12/21  7:17 PM  Result Value Ref Range   ABO/RH(D) O POS    Antibody Screen NEG    Sample Expiration      03/15/2021,2359 Performed at [facility], [address] [place], [place] [address]   Resp Panel by RT-PCR (Flu A&B, Covid) Nasopharyngeal Swab     Status: None   Collection Time: 03/12/21  7:17 PM   Specimen: Nasopharyngeal Swab; Nasopharyngeal(NP) swabs in vial transport medium  Result Value Ref Range   SARS Coronavirus 2 by RT PCR NEGATIVE NEGATIVE   Influenza A by PCR NEGATIVE NEGATIVE   Influenza B by PCR NEGATIVE NEGATIVE    Assessment: 30 y.o. [id number] [redacted]w[redacted]d by 03/15/2021, term labor  Plan: 1) Term labor - expectant management  2) Fetus - cat I tracing  3) PNL - Blood type --/--/O POS (08/02 1917) / Anti-bodyscreen NEG (08/02 1917) / Rubella 1.42 (02/24 1018) / Varicella Immune / RPR Non Reactive (05/04 1201) / HBsAg Negative (02/24 1018) / HIV Non Reactive (05/04 1201) / 1-hr OGTT 73/ GBS Positive/-- (07/13 1021) - PCN GBS ppx  4) Immunization History -  Immunization History   Administered Date(s) Administered   Tdap 01/11/2021    5) Disposition - pending delivery  Vena Austria, MD, [facility], [facility] 03/12/2021, 9:12 PM

## 2021-03-12 NOTE — Progress Notes (Signed)
Routine Prenatal Care Visit  Subjective  Tracie Meyer is a 30 y.o. [phone] at [redacted]w[redacted]d being seen today for ongoing prenatal care.  She is currently monitored for the following issues for this low-risk pregnancy and has Encounter for supervision of other normal pregnancy, second trimester and [redacted] weeks gestation of pregnancy on their problem list.  ----------------------------------------------------------------------------------- Patient reports no complaints.   Contractions: Irregular. Vag. Bleeding: None.  Movement: Present. Denies leaking of fluid.  ----------------------------------------------------------------------------------- The following portions of the patient's history were reviewed and updated as appropriate: allergies, current medications, past family history, past medical history, past social history, past surgical history and problem list. Problem list updated.   Objective  Blood pressure 120/72, height 5\' 7"  (1.702 m), weight 159 lb 6.4 oz (72.3 kg), last menstrual period 06/08/2020. Pregravid weight 110 lb (49.9 kg) Total Weight Gain 49 lb 6.4 oz (22.4 kg) Urinalysis:      Fetal Status: Fetal Heart Rate (bpm): 145   Movement: Present  Presentation: Vertex  General:  Alert, oriented and cooperative. Patient is in no acute distress.  Skin: Skin is warm and dry. No rash noted.   Cardiovascular: Normal heart rate noted  Respiratory: Normal respiratory effort, no problems with respiration noted  Abdomen: Soft, gravid, appropriate for gestational age. Pain/Pressure: Present     Pelvic:  Cervical exam performed Dilation: 3 Effacement (%): 80 Station: -3  Extremities: Normal range of motion.  Edema: None  Mental Status: Normal mood and affect. Normal behavior. Normal judgment and thought content.     Assessment   30 y.o. 26 at [redacted]w[redacted]d by  03/15/2021, Date entered prior to episode creation presenting for routine prenatal visit  Plan   pregnancy 3  Problems (from  10/04/20 to present)     Problem Noted Resolved   Encounter for supervision of other normal pregnancy, second trimester 10/04/2020 by 10/06/2020, CNM No   Overview Addendum 02/05/2021 10:20 AM by 02/07/2021, MD     Nursing Staff Provider  Office Location  [facility] Dating   LMP  Language  English Anatomy Natale Milch  complete  Flu Vaccine   declined Genetic Screen  NIPS: Diploid XX  TDaP vaccine   01/11/2021 Hgb A1C or  GTT Third trimester : 38  Rhogam   n/a   LAB RESULTS   Feeding Plan  formula Blood Type O/Positive/-- (02/24 1018)   Contraception  Depo Provera Antibody Negative (02/24 1018) negative   Circumcision  Rubella  immune  Pediatrician   RPR Non Reactive (02/24 1018) non-reactive  Support Person  12-16-1994 - boyfriend HBsAg Negative (02/24 1018) negative  Prenatal Classes  declined HIV Non Reactive (02/24 1018) non-reactive    Varicella   immune  BTL Consent  n/a GBS  (For PCN allergy, check sensitivities)        VBAC Consent  n/a Pap  10/04/20 - NILM    Hgb Electro   n/a   Covid  unvaccinated - encouraged at [facility]      SMA        Anemia in pregnancy- starting oral iron 12/26/2020          Membranes swept  IOL scheduled for 03/21/2021 8 AM  Gestational age appropriate obstetric precautions including but not limited to vaginal bleeding, contractions, leaking of fluid and fetal movement were reviewed in detail with the patient.    Return in about 1 week (around 03/19/2021) for ROB in person.  05/19/2021 MD [facility], [facility] 03/12/2021, 10:20 AM

## 2021-03-12 NOTE — Patient Instructions (Addendum)
Covid Testing at 03/19/2021 between 8-10:30 am, Drive up testing in front of the [employer]. Follow signs for Pre-admission testing. Please wear a mask.  Induction 03/21/2021 at 8 am . Enter through the [facility] for an 8 AM induction. Please enter through the ER for a midnight or 5 AM induction.  Please eat a meal prior to your arrival.    Labor Induction Labor induction is when steps are taken to cause a pregnant woman to begin the labor process. Most women go into labor on their own between 37 weeks and 42 weeks of pregnancy. When this does not happen, or when there is a medical needfor labor to begin, steps may be taken to induce, or bring on, labor. Labor induction causes a pregnant woman's uterus to contract. It also causes the cervix to soften (ripen), open (dilate), and thin out. Usually, labor is not induced before 39 weeks of pregnancyunless there is a medical reason to do so. When is labor induction considered? Labor induction may be right for you if: Your pregnancy lasts longer than 41 to 42 weeks. Your placenta is separating from your uterus (placental abruption). You have a rupture of membranes and your labor does not begin. You have health problems, like diabetes or high blood pressure (preeclampsia) during your pregnancy. Your baby has stopped growing or does not have enough amniotic fluid. Before labor induction begins, your health care provider will consider the following factors: Your medical condition and the baby's condition. How many weeks you have been pregnant. How mature the baby's lungs are. The condition of your cervix. The position of the baby. The size of your birth canal. Tell a health care provider about: Any allergies you have. All medicines you are taking, including vitamins, herbs, eye drops, creams, and over-the-counter medicines. Any problems you or your family members have had with anesthetic medicines. Any surgeries you have had. Any blood  disorders you have. Any medical conditions you have. What are the risks? Generally, this is a safe procedure. However, problems may occur, including: Failed induction. Changes in fetal heart rate, such as being too high, too low, or irregular (erratic). Infection in the mother or the baby. Increased risk of having a cesarean delivery. Breaking off (abruption) of the placenta from the uterus. This is rare. Rupture of the uterus. This is very rare. Your baby could fail to get enough blood flow or oxygen. This can be life-threatening. When induction is needed for medical reasons, the benefits generally outweighthe risks. What happens during the procedure? During the procedure, your health care provider will use one of these methods to induce labor: Stripping the membranes. In this method, the amniotic sac tissue is gently separated from the cervix. This causes the following to happen: Your cervix stretches, which in turn causes the release of prostaglandins. Prostaglandins induce labor and cause the uterus to contract. This procedure is often done in an office visit. You will be sent home to wait for contractions to begin. Prostaglandin medicine. This medicine starts contractions and causes the cervix to dilate and ripen. This can be taken by mouth (orally) or by being inserted into the vagina (suppository). Inserting a small, thin tube (catheter) with a balloon into the vagina and then expanding the balloon with water to dilate the cervix. Breaking the water. In this method, a small instrument is used to make a small hole in the amniotic sac. This eventually causes the amniotic sac to break. Contractions should begin within a few hours.  Medicine to trigger or strengthen contractions. This medicine is given through an IV that is inserted into a vein in your arm. This procedure may vary among health care providers and hospitals. Where to find more information March of [employer]:  www.marchofdimes.org [employer] of [employer]st[employer]ians and [employer]necologists: www.acog.org Summary Labor induction causes a pregnant woman's uterus to contract. It also causes the cervix to soften (ripen), open (dilate), and thin out. Labor is usually not induced before 39 weeks of pregnancy unless there is a medical reason to do so. When induction is needed for medical reasons, the benefits generally outweigh the risks. Talk with your health care provider about which methods of labor induction are right for you. This information is not intended to replace advice given to you by your health care provider. Make sure you discuss any questions you have with your healthcare provider. Document Revised: 05/10/2020 Document Reviewed: 05/10/2020 Elsevier Patient Education  2022 Elsevier Inc.   Vaginal Delivery  Vaginal delivery means that you give birth by pushing your baby out of your birth canal (vagina). Your health care team will help you before, during, and after vaginaldelivery. Birth experiences are unique for every woman and every pregnancy, and birthexperiences vary depending on where you choose to give birth. What are the risks and benefits? Generally, this is safe. However, problems may occur, including: Bleeding. Infection. Damage to other structures such as vaginal tearing. Allergic reactions to medicines. Despite the risks, benefits of vaginal delivery include less risk of bleeding and infection and a shorter recovery time compared to a Cesarean delivery.Cesarean delivery, or C-section, is the surgical delivery of a baby. What happens when I arrive at the birth center or hospital? Once you are in labor and have been admitted into the hospital or birth center, your health care team may: Review your pregnancy history and any concerns that you have. Talk with you about your birth plan and discuss pain control options. Check your blood pressure, breathing, and heartbeat. Assess your  baby's heartbeat. Monitor your uterus for contractions. Check whether your bag of water (amniotic sac) has broken (ruptured). Insert an IV into one of your veins. This may be used to give you fluids and medicines. Monitoring Your health care team may assess your contractions (uterine monitoring) and your baby's heart rate (fetal monitoring). You may need to be monitored: Often, but not continuously (intermittently). All the time or for long periods at a time (continuously). Continuous monitoring may be needed if: You are taking certain medicines, such as medicine to relieve pain or make your contractions stronger. You have pregnancy or labor complications. Monitoring may be done by: Placing a special stethoscope or a handheld monitoring device on your abdomen to check your baby's heartbeat and to check for contractions. Placing monitors on your abdomen (external monitors) to record your baby's heartbeat and the frequency and length of contractions. Placing monitors inside your uterus through your vagina (internal monitors) to record your baby's heartbeat and the frequency, length, and strength of your contractions. Depending on the type of monitor, it may remain in your uterus or on your baby's head until birth. Telemetry. This is a type of continuous monitoring that can be done with external or internal monitors. Instead of having to stay in bed, you are able to move around. Physical exam Your health care team may perform frequent physical exams. This may include: Checking how and where your baby is positioned in your uterus. Checking your cervix to determine: Whether it is thinning  out (effacing). Whether it is opening up (dilating). What happens during labor and delivery?  Normal labor and delivery is divided into the following three stages: Stage 1 This is the longest stage of labor. Throughout this stage, you will feel contractions. Contractions generally feel mild, infrequent, and  irregular at first. They get stronger, more frequent, and more regular as you move through this stage. You may have contractions about every 2-3 minutes. This stage ends when your cervix is completely dilated to 4 inches (10 cm) and completely effaced. Stage 2 This stage starts once your cervix is completely effaced and dilated and lasts until the delivery of your baby. This is the stage where you will feel an urge to push your baby out of your vagina. You may feel stretching and burning pain, especially when the widest part of your baby's head passes through the vaginal opening (crowning). Once your baby is delivered, the umbilical cord will be clamped and cut. Timing of cutting the cord will depend on your wishes, your baby's health, and your health care provider's practices. Your baby will be placed on your bare chest (skin-to-skin contact) in an upright position and covered with a warm blanket. If you are choosing to breastfeed, watch your baby for feeding cues, like rooting or sucking, and help the baby to your breast for his or her first feeding. Stage 3 This stage starts immediately after the birth of your baby and ends after you deliver the placenta. This stage may take anywhere from 5 to 30 minutes. After your baby has been delivered, you will feel contractions as your body expels the placenta. These contractions also help your uterus get smaller and reduce bleeding. What can I expect after labor and delivery? After labor is over, you and your baby will be assessed closely until you are ready to go home. Your health care team will teach you how to care for yourself and your baby. You and your baby may be encouraged to stay in the same room (rooming in) during your hospital stay. This will help promote early bonding and successful breastfeeding. Your uterus will be checked and massaged regularly (fundal massage). You may continue to receive fluids and medicines through an IV. You will have  some soreness and pain in your abdomen, vagina, and the area of skin between your vaginal opening and your anus (perineum). If an incision was made near your vagina (episiotomy) or if you had some vaginal tearing during delivery, cold compresses may be placed on your episiotomy or your tear. This helps to reduce pain and swelling. It is normal to have vaginal bleeding after delivery. Wear a sanitary pad for vaginal bleeding and discharge. Summary Vaginal delivery means that you will give birth by pushing your baby out of your birth canal (vagina). Your health care team will monitor you and your baby throughout the stages of labor. After you deliver your baby, your health care team will continue to assess you and your baby to ensure you are both recovering as expected after delivery. This information is not intended to replace advice given to you by your health care provider. Make sure you discuss any questions you have with your healthcare provider. Document Revised: 06/25/2020 Document Reviewed: 06/25/2020 Elsevier Patient Education  2022 [employer].

## 2021-03-13 ENCOUNTER — Encounter: Payer: Self-pay | Admitting: Obstetrics and Gynecology

## 2021-03-13 ENCOUNTER — Inpatient Hospital Stay: Payer: Medicaid Other | Admitting: Anesthesiology

## 2021-03-13 DIAGNOSIS — Z3A39 39 weeks gestation of pregnancy: Secondary | ICD-10-CM

## 2021-03-13 LAB — RPR: RPR Ser Ql: NONREACTIVE

## 2021-03-13 MED ORDER — EPHEDRINE 5 MG/ML INJ
10.0000 mg | INTRAVENOUS | Status: DC | PRN
  Filled 2021-03-13: qty 2

## 2021-03-13 MED ORDER — FENTANYL-BUPIVACAINE-NACL 0.5-0.125-0.9 MG/250ML-% EP SOLN
EPIDURAL | Status: AC
  Filled 2021-03-13: qty 250

## 2021-03-13 MED ORDER — COCONUT OIL OIL: 1.0000 "application " | TOPICAL_OIL | Status: DC | PRN

## 2021-03-13 MED ORDER — OXYCODONE-ACETAMINOPHEN 5-325 MG PO TABS: 2.0000 | ORAL_TABLET | ORAL | Status: DC | PRN

## 2021-03-13 MED ORDER — ACETAMINOPHEN 325 MG PO TABS
650.0000 mg | ORAL_TABLET | ORAL | Status: DC | PRN
  Administered 2021-03-14: 650 mg via ORAL
  Filled 2021-03-13: qty 2

## 2021-03-13 MED ORDER — SODIUM CHLORIDE 0.9 % IV SOLN
INTRAVENOUS | Status: DC | PRN
  Administered 2021-03-13: 10 mL via EPIDURAL

## 2021-03-13 MED ORDER — SENNOSIDES-DOCUSATE SODIUM 8.6-50 MG PO TABS
2.0000 | ORAL_TABLET | ORAL | Status: DC
  Administered 2021-03-13: 2 via ORAL
  Filled 2021-03-13: qty 2

## 2021-03-13 MED ORDER — DIPHENHYDRAMINE HCL 25 MG PO CAPS: 25.0000 mg | ORAL_CAPSULE | Freq: Four times a day (QID) | ORAL | Status: DC | PRN

## 2021-03-13 MED ORDER — BENZOCAINE-MENTHOL 20-0.5 % EX AERO
1.0000 "application " | INHALATION_SPRAY | CUTANEOUS | Status: DC | PRN
  Filled 2021-03-13: qty 56

## 2021-03-13 MED ORDER — FENTANYL-BUPIVACAINE-NACL 0.5-0.125-0.9 MG/250ML-% EP SOLN
12.0000 mL/h | EPIDURAL | Status: DC | PRN
  Administered 2021-03-13: 12 mL/h via EPIDURAL

## 2021-03-13 MED ORDER — IBUPROFEN 600 MG PO TABS
600.0000 mg | ORAL_TABLET | Freq: Four times a day (QID) | ORAL | Status: DC
  Administered 2021-03-13 – 2021-03-14 (×6): 600 mg via ORAL
  Filled 2021-03-13 (×6): qty 1

## 2021-03-13 MED ORDER — DIPHENHYDRAMINE HCL 50 MG/ML IJ SOLN: 12.5000 mg | INTRAMUSCULAR | Status: DC | PRN

## 2021-03-13 MED ORDER — ONDANSETRON HCL 4 MG/2ML IJ SOLN: 4.0000 mg | INTRAMUSCULAR | Status: DC | PRN

## 2021-03-13 MED ORDER — PHENYLEPHRINE 40 MCG/ML (10ML) SYRINGE FOR IV PUSH (FOR BLOOD PRESSURE SUPPORT)
80.0000 ug | PREFILLED_SYRINGE | INTRAVENOUS | Status: DC | PRN
  Filled 2021-03-13: qty 10

## 2021-03-13 MED ORDER — PRENATAL MULTIVITAMIN CH
1.0000 | ORAL_TABLET | Freq: Every day | ORAL | Status: DC
  Administered 2021-03-13 – 2021-03-14 (×2): 1 via ORAL
  Filled 2021-03-13 (×2): qty 1

## 2021-03-13 MED ORDER — WITCH HAZEL-GLYCERIN EX PADS
1.0000 "application " | MEDICATED_PAD | CUTANEOUS | Status: DC | PRN
  Filled 2021-03-13: qty 100

## 2021-03-13 MED ORDER — LIDOCAINE HCL (PF) 1 % IJ SOLN
INTRAMUSCULAR | Status: DC | PRN
  Administered 2021-03-13: 3 mL via SUBCUTANEOUS

## 2021-03-13 MED ORDER — ONDANSETRON HCL 4 MG PO TABS: 4.0000 mg | ORAL_TABLET | ORAL | Status: DC | PRN

## 2021-03-13 MED ORDER — SIMETHICONE 80 MG PO CHEW: 80.0000 mg | CHEWABLE_TABLET | ORAL | Status: DC | PRN

## 2021-03-13 MED ORDER — LACTATED RINGERS IV SOLN: 500.0000 mL | Freq: Once | INTRAVENOUS | Status: DC

## 2021-03-13 MED ORDER — DIBUCAINE (PERIANAL) 1 % EX OINT: 1.0000 "application " | TOPICAL_OINTMENT | CUTANEOUS | Status: DC | PRN

## 2021-03-13 MED ORDER — LIDOCAINE-EPINEPHRINE (PF) 1.5 %-1:200000 IJ SOLN
INTRAMUSCULAR | Status: DC | PRN
  Administered 2021-03-13: 3 mL via EPIDURAL

## 2021-03-13 MED ORDER — OXYCODONE-ACETAMINOPHEN 5-325 MG PO TABS: 1.0000 | ORAL_TABLET | ORAL | Status: DC | PRN

## 2021-03-13 NOTE — Progress Notes (Signed)
   Subjective:  Feeling more painful contractions  Objective:   Vitals: Blood pressure 117/62, pulse 72, temperature 98 F (36.7 C), temperature source Oral, resp. rate 18, height 5\' 7"  (1.702 m), weight 72.3 kg, last menstrual period 06/08/2020. General: NAD Abdomen: gravid, non-tender Cervical Exam:  Dilation: 6 Effacement (%): 80 Cervical Position: Posterior Station: -2 Presentation: Vertex Exam by:: JDaley AROM clear  FHT: 125, moderate, +accels, no decels Toco: q52min  Results for orders placed or performed during the hospital encounter of 03/12/21 (from the past 24 hour(s))  ROM Plus ([facility] only)     Status: None   Collection Time: 03/12/21  6:56 PM  Result Value Ref Range   Rom Plus NEGATIVE   CBC     Status: Abnormal   Collection Time: 03/12/21  7:17 PM  Result Value Ref Range   WBC 12.9 (H) 4.0 - 10.5 K/uL   RBC 3.42 (L) 3.87 - 5.11 MIL/uL   Hemoglobin 10.1 (L) 12.0 - 15.0 g/dL   HCT 05/12/21 (L) 50.9 - 32.6 %   MCV 90.1 80.0 - 100.0 fL   MCH 29.5 26.0 - 34.0 pg   MCHC 32.8 30.0 - 36.0 g/dL   RDW 71.2 45.8 - 09.9 %   Platelets 160 150 - 400 K/uL   nRBC 0.0 0.0 - 0.2 %  Type and screen     Status: None   Collection Time: 03/12/21  7:17 PM  Result Value Ref Range   ABO/RH(D) O POS    Antibody Screen NEG    Sample Expiration      03/15/2021,2359 Performed at [facility], [address] [place], [place]   Resp Panel by RT-PCR (Flu A&B, Covid) Nasopharyngeal Swab     Status: None   Collection Time: 03/12/21  7:17 PM   Specimen: Nasopharyngeal Swab; Nasopharyngeal(NP) swabs in vial transport medium  Result Value Ref Range   SARS Coronavirus 2 by RT PCR NEGATIVE NEGATIVE   Influenza A by PCR NEGATIVE NEGATIVE   Influenza B by PCR NEGATIVE NEGATIVE    Assessment:   30 y.o. 28 [redacted]w[redacted]d term labor  Plan:   1) Labor - AROM performed  2) Fetus - cat I tracing  [redacted]w[redacted]d, MD, [facility], [facility] 03/13/2021, 1:39 AM

## 2021-03-13 NOTE — Discharge Summary (Signed)
OB Discharge Summary     Patient Name: Tracie Meyer DOB: 09-12-1990 MRN: [id number]  Date of admission: 03/12/2021 Delivering MD: Vena Austria   Date of discharge: 03/14/2021  Admitting diagnosis: Labor and delivery indication for care or intervention [O75.9] Encounter for elective induction of labor [Z34.90] Intrauterine pregnancy: [redacted]w[redacted]d     Secondary diagnosis:  Principal Problem:   Encounter for supervision of other normal pregnancy, second trimester Active Problems:   Labor and delivery indication for care or intervention   SVD (spontaneous vaginal delivery)   Postpartum care following vaginal delivery  Additional problems: none     Discharge diagnosis: Term Pregnancy Delivered                                                                                                Post partum procedures: none  Augmentation: AROM  Complications: None  Hospital course:  Onset of Labor With Vaginal Delivery      30 y.o. yo [id number] at [redacted]w[redacted]d was admitted in Active Labor on 03/12/2021. Patient had an uncomplicated labor course as follows:  Membrane Rupture Time/Date: 1:20 AM ,03/13/2021   Delivery Method:Vaginal, Spontaneous  Episiotomy: None  Lacerations:  None  Patient had an uncomplicated postpartum course.  She is ambulating, tolerating a regular diet, passing flatus, and urinating well. Patient is discharged home in stable condition on 03/14/21.  Newborn Data: Birth date:03/13/2021  Birth time:5:27 AM  Gender:Female  Living status:Living  Apgars:9 ,10  Weight:3800 g   Physical exam  Vitals:   03/13/21 1000 03/13/21 1205 03/13/21 1949 03/13/21 2339  BP: 107/67 119/74 114/64 123/85  Pulse: 65 61 71 75  Resp: 16 18 16 16   Temp: 98 F (36.7 C) 98 F (36.7 C) 98.7 F (37.1 C) 98.1 F (36.7 C)  TempSrc: Oral Oral Oral Oral  SpO2: 100% 100% 99% 100%  Weight:      Height:       General: alert, cooperative, and no distress Lochia: appropriate Uterine Fundus:  firm Incision: N/A DVT Evaluation: No evidence of DVT seen on physical exam. Labs: Lab Results  Component Value Date   WBC 12.2 (H) 03/14/2021   HGB 8.9 (L) 03/14/2021   HCT 27.6 (L) 03/14/2021   MCV 89.3 03/14/2021   PLT 139 (L) 03/14/2021   CMP Latest Ref Rng & Units 12/26/2019  Glucose 70 - 99 mg/dL 12/28/2019)  BUN 6 - 20 mg/dL 15  Creatinine 676(H - 2.09 mg/dL 4.70  Sodium 9.62 - 836 mmol/L 140  Potassium 3.5 - 5.1 mmol/L 4.0  Chloride 98 - 111 mmol/L 107  CO2 22 - 32 mmol/L 26  Calcium 8.9 - 10.3 mg/dL 8.9  Total Protein 6.5 - 8.1 g/dL -  Total Bilirubin 0.3 - 1.2 mg/dL -  Alkaline Phos 38 - 629 U/L -  AST 15 - 41 U/L -  ALT 14 - 54 U/L -    Discharge instruction: per After Visit Summary and "Baby and Me Booklet".  After visit meds:  Allergies as of 03/14/2021       Reactions   Morphine And Related  Immediate vein hypersensitivity, swelling, redness and rash.    No Known Allergies Other (See Comments)        Medication List     TAKE these medications    multivitamin-prenatal 27-0.8 MG Tabs tablet Take 1 tablet by mouth daily at 12 noon.        Diet: routine diet  Activity: Advance as tolerated. Pelvic rest for 6 weeks.   Outpatient follow up:6 weeks Follow up Appt: Future Appointments  Date Time Provider Department Center  03/29/2021 11:30 AM Vena Austria, MD [facility] None     Postpartum contraception:BTL   Newborn Delivery   Birth date/time: 03/13/2021 05:27:00 Delivery type: Vaginal, Spontaneous      Baby Feeding:  formula Disposition:home with mother   03/14/2021 Tresea Mall, CNM

## 2021-03-13 NOTE — Anesthesia Procedure Notes (Addendum)
Epidural Patient location during procedure: OB Start time: 03/13/2021 2:00 AM End time: 03/13/2021 2:19 AM  Staffing Anesthesiologist: Corinda Gubler, MD Performed: anesthesiologist   Preanesthetic Checklist Completed: patient identified, IV checked, site marked, risks and benefits discussed, surgical consent, monitors and equipment checked, pre-op evaluation and timeout performed  Epidural Patient position: sitting Prep: ChloraPrep Patient monitoring: heart rate, continuous pulse ox and blood pressure Approach: midline Location: L3-L4 Injection technique: LOR saline  Needle:  Needle type: Tuohy  Needle gauge: 17 G Needle length: 9 cm and 9 Needle insertion depth: 4 cm Catheter type: closed end flexible Catheter size: 19 Gauge Catheter at skin depth: 9 cm Test dose: negative and 1.5% lidocaine with Epi 1:200 K  Assessment Sensory level: T10 Events: blood not aspirated, injection not painful, no injection resistance, no paresthesia and negative IV test  Additional Notes first attempt Pt. Evaluated and documentation done after procedure finished. Patient identified. Risks/Benefits/Options discussed with patient including but not limited to bleeding, infection, nerve damage, paralysis, failed block, incomplete pain control, headache, blood pressure changes, nausea, vomiting, reactions to medication both or allergic, itching and postpartum back pain. Confirmed with bedside nurse the patient's most recent platelet count. Confirmed with patient that they are not currently taking any anticoagulation, have any bleeding history or any family history of bleeding disorders. Patient expressed understanding and wished to proceed. All questions were answered. Sterile technique was used throughout the entire procedure. Please see nursing notes for vital signs. Test dose was given through epidural catheter and negative prior to continuing to dose epidural or start infusion. Warning signs of high block  given to the patient including shortness of breath, tingling/numbness in hands, complete motor block, or any concerning symptoms with instructions to call for help. Patient was given instructions on fall risk and not to get out of bed. All questions and concerns addressed with instructions to call with any issues or inadequate analgesia.     Patient tolerated the insertion well without immediate complications.  Reason for block: procedure for painReason for block:procedure for pain

## 2021-03-13 NOTE — Progress Notes (Signed)
Post Partum Day 0 Subjective: no complaints, up ad lib, voiding, tolerating PO, and she has had a few hours sleep since her delivery  Objective: Blood pressure 119/74, pulse 61, temperature 98 F (36.7 C), temperature source Oral, resp. rate 18, height 5\' 7"  (1.702 m), weight 72.3 kg, last menstrual period 06/08/2020, SpO2 100 %, unknown if currently breastfeeding.  Physical Exam:  General: alert, cooperative, fatigued, and no distress Lochia: appropriate Uterine Fundus: firm Incision: healing well DVT Evaluation: No evidence of DVT seen on physical exam. Negative Homan's sign.  Recent Labs    03/12/21 1917  HGB 10.1*  HCT 30.8*    Assessment/Plan: Plan for discharge tomorrow and Contraception she isplanning a BTL, but needs to sign Medicaid tubal papers.will need to arrange for her papers to be signed, and then plan for an Interval BTL. She isinterested in using OCPs when discharged, until her BTL is performed   LOS: 1 day   05/12/21 03/13/2021, 2:13 PM

## 2021-03-13 NOTE — Anesthesia Preprocedure Evaluation (Signed)
Anesthesia Evaluation  Patient identified by MRN, date of birth, ID band Patient awake    Reviewed: Allergy & Precautions, NPO status , Patient's Chart, lab work & pertinent test results  History of Anesthesia Complications Negative for: history of anesthetic complications  Airway Mallampati: I  TM Distance: >3 FB Neck ROM: Full    Dental no notable dental hx. (+) Teeth Intact   Pulmonary neg pulmonary ROS, neg sleep apnea, neg COPD, Patient abstained from smoking.Not current smoker, former smoker,    Pulmonary exam normal breath sounds clear to auscultation       Cardiovascular Exercise Tolerance: Good METS(-) hypertension(-) CAD and (-) Past MI negative cardio ROS  (-) dysrhythmias  Rhythm:Regular Rate:Normal - Systolic murmurs    Neuro/Psych negative neurological ROS  negative psych ROS   GI/Hepatic neg GERD  ,(+)     (-) substance abuse  ,   Endo/Other  neg diabetes  Renal/GU negative Renal ROS     Musculoskeletal   Abdominal   Peds  Hematology   Anesthesia Other Findings Past Medical History: No date: History of fainting spells of unknown cause  Reproductive/Obstetrics (+) Pregnancy                             Anesthesia Physical Anesthesia Plan  ASA: 2  Anesthesia Plan: Epidural   Post-op Pain Management:    Induction:   PONV Risk Score and Plan: 2 and Treatment may vary due to age or medical condition and Ondansetron  Airway Management Planned: Natural Airway  Additional Equipment:   Intra-op Plan:   Post-operative Plan:   Informed Consent: I have reviewed the patients History and Physical, chart, labs and discussed the procedure including the risks, benefits and alternatives for the proposed anesthesia with the patient or authorized representative who has indicated his/her understanding and acceptance.       Plan Discussed with: Surgeon  Anesthesia Plan  Comments: (Discussed R/B/A of neuraxial anesthesia technique with patient: - rare risks of spinal/epidural hematoma, nerve damage, infection - Risk of PDPH - Risk of itching - Risk of nausea and vomiting - Risk of poor block necessitating replacement of epidural. - Risk of allergic reactions. Patient voiced understanding.)        Anesthesia Quick Evaluation

## 2021-03-14 LAB — CBC
HCT: 27.6 % — ABNORMAL LOW (ref 36.0–46.0)
Hemoglobin: 8.9 g/dL — ABNORMAL LOW (ref 12.0–15.0)
MCH: 28.8 pg (ref 26.0–34.0)
MCHC: 32.2 g/dL (ref 30.0–36.0)
MCV: 89.3 fL (ref 80.0–100.0)
Platelets: 139 10*3/uL — ABNORMAL LOW (ref 150–400)
RBC: 3.09 MIL/uL — ABNORMAL LOW (ref 3.87–5.11)
RDW: 13.7 % (ref 11.5–15.5)
WBC: 12.2 10*3/uL — ABNORMAL HIGH (ref 4.0–10.5)
nRBC: 0 % (ref 0.0–0.2)

## 2021-03-14 NOTE — Anesthesia Postprocedure Evaluation (Signed)
Anesthesia Post Note  Patient: Tracie Meyer  Procedure(s) Performed: AN AD HOC LABOR EPIDURAL  Patient location during evaluation: Mother Baby Anesthesia Type: Epidural Level of consciousness: awake and alert Pain management: pain level controlled Vital Signs Assessment: post-procedure vital signs reviewed and stable Respiratory status: spontaneous breathing, nonlabored ventilation and respiratory function stable Cardiovascular status: stable Postop Assessment: no headache, no backache and epidural receding Anesthetic complications: no   No notable events documented.   Last Vitals:  Vitals:   03/13/21 1949 03/13/21 2339  BP: 114/64 123/85  Pulse: 71 75  Resp: 16 16  Temp: 37.1 C 36.7 C  SpO2: 99% 100%    Last Pain:  Vitals:   03/14/21 0850  TempSrc:   PainSc: 1                  Jamilett Ferrante,  Alessandra Bevels

## 2021-03-14 NOTE — Discharge Instructions (Addendum)
Discharge Instructions:   Postpartum follow-up  BTL planning: Friday, 8/19 at 11:30am with Dr. Bonney Aid at [facility] office!  If there are any new medications, they have been ordered and will be available for pickup at the listed pharmacy on your way home from the hospital.   Call office if you have any of the following: headache, visual changes, fever >101.0 F, chills, shortness of breath, breast concerns, excessive vaginal bleeding, incision drainage or problems, leg pain or redness, depression or any other concerns. If you have vaginal discharge with an odor, let your doctor know.   It is normal to bleed for up to 6 weeks. You should not soak through more than 1 pad in 1 hour. If you have a blood clot larger than your fist with continued bleeding, call your doctor.   Activity: Do not lift > 10 lbs for 6 weeks (do not lift anything heavier than your baby). No intercourse, tampons, swimming pools, hot tubs, baths (only showers) for 6 weeks.  No driving for 1-2 weeks. Continue prenatal vitamin, especially if breastfeeding. Increase calories and fluids (water) while breastfeeding.   Your milk will come in, in the next couple of days (right now it is colostrum). You may have a slight fever when your milk comes in, but it should go away on its own.  If it does not, and rises above 101 F please call the doctor. You will also feel achy and your breasts will be firm. They will also start to leak. If you are breastfeeding, continue as you have been and you can pump/express milk for comfort.   If you have too much milk, your breasts can become engorged, which could lead to mastitis. This is an infection of the milk ducts. It can be very painful and you will need to notify your doctor to obtain a prescription for antibiotics. You can also treat it with a shower or hot/cold compress.   For concerns about your baby, please call your pediatrician.  For breastfeeding concerns, the lactation consultant can  be reached at [phone].   Postpartum blues (feelings of happy one minute and sad another minute) are normal for the first few weeks but if it gets worse let your doctor know.   Congratulations! We enjoyed caring for you and your new bundle of joy!

## 2021-03-14 NOTE — Progress Notes (Signed)
Patient discharged home with infant. Discharge instructions and prescriptions given and reviewed with patient. Patient verbalized understanding.   Follow-up appointment scheduled for Friday, August 19th at 11:30am with Dr. Bonney Aid at [facility] for postpartum follow-up and BTL planning.   Will be escorted out by volunteers.

## 2021-03-19 ENCOUNTER — Encounter: Payer: Medicaid Other | Admitting: Obstetrics

## 2021-03-29 ENCOUNTER — Other Ambulatory Visit: Payer: Self-pay | Admitting: Obstetrics & Gynecology

## 2021-03-29 ENCOUNTER — Encounter: Payer: Self-pay | Admitting: Obstetrics and Gynecology

## 2021-03-29 ENCOUNTER — Telehealth: Payer: Self-pay

## 2021-03-29 ENCOUNTER — Other Ambulatory Visit: Payer: Self-pay

## 2021-03-29 ENCOUNTER — Ambulatory Visit (INDEPENDENT_AMBULATORY_CARE_PROVIDER_SITE_OTHER): Payer: Medicaid Other | Admitting: Obstetrics and Gynecology

## 2021-03-29 VITALS — BP 124/78 | Ht 67.0 in | Wt 137.0 lb

## 2021-03-29 DIAGNOSIS — Z0130 Encounter for examination of blood pressure without abnormal findings: Secondary | ICD-10-CM

## 2021-03-29 DIAGNOSIS — Z308 Encounter for other contraceptive management: Secondary | ICD-10-CM

## 2021-03-29 NOTE — Telephone Encounter (Signed)
-----   Message from Nadara Mustard, MD sent at 03/14/2021 10:02 AM EDT ----- Regarding: RE: Surgery Ok  ----- Message ----- From: Letta Pate Sent: 03/14/2021   9:32 AM EDT To: Nadara Mustard, MD Subject: RE: Surgery                                    To satisfy Medicaid wait period, you first available OR is 9/15. H&P can be done 9/12 with a 6 wk PP visit. Do you want a 1 or 2 week post op for the BTL?   ----- Message ----- From: Nadara Mustard, MD Sent: 03/14/2021   8:14 AM EDT To: [name] Subject: Surgery                                        Surgery Booking Request Patient Full Name:  Tracie Meyer  MRN: [id number]  DOB: 1990/10/05  Surgeon: Letitia Libra, MD  Requested Surgery Date and Time: 5-6 weeks from now (just signed tubal consent form) Primary Diagnosis AND Code: Steriklization Secondary Diagnosis and Code:  Surgical Procedure: Laparoscopy with Tubal Partial Salingectomy RNFA Requested?: No L&D Notification: No Admission Status: same day surgery Length of Surgery: 25 min Special Case Needs: No H&P: Yes Phone Interview???:  Yes Interpreter: No Medical Clearance:  No Special Scheduling Instructions: No Any known health/anesthesia issues, diabetes, sleep apnea, latex allergy, defibrillator/pacemaker?: No Acuity: P3   (P1 highest, P2 delay may cause harm, P3 low, elective gyn, P4 lowest) Post op follow up visits: same as 6 week pp visit if timed well

## 2021-03-29 NOTE — Telephone Encounter (Signed)
Spoke w patient in person to schedule BTL w [facility]  DOS 9/15  H&P 9/12 @ 1350   Pre-admit phone call appointment 9/6 @ 8-1  Advised that pt may also receive calls from the hospital pharmacy and pre-service center.  Confirmed pt has [employer] as Editor, commissioning. No secondary insurance.

## 2021-03-29 NOTE — Progress Notes (Signed)
Obstetrics & Gynecology Office Visit   Chief Complaint:  Chief Complaint  Patient presents with   Post-op Follow-up    History of Present Illness: 30 y.o. [phone] being seen for follow up blood pressure check today.  The patient is  postpartum . She is currently on no antihypertensives.  She reports no current symptoms attributable to her blood pressure.  Medication list reviewed medications which may contribute to BP elevation were not noted and no medications contraindicated for use in patient with current hypertension were noted.  Doing well mood wise EPDS of 0.  Review of Systems: Review of Systems  Constitutional: Negative.   Gastrointestinal: Negative.   Genitourinary: Negative.   Psychiatric/Behavioral: Negative.      Past Medical History:  Past Medical History:  Diagnosis Date   History of fainting spells of unknown cause     Past Surgical History:  Past Surgical History:  Procedure Laterality Date   APPENDECTOMY  2013    Gynecologic History: No LMP recorded.  Obstetric History: [id number]  Family History:  Family History  Problem Relation Age of Onset   Healthy Mother    Testicular cancer Father    Cancer Maternal Grandmother        pancreatic   Depression Maternal Grandfather     Social History:  Social History   Socioeconomic History   Marital status: Single    Spouse name: Not on file   Number of children: 2   Years of education: Not on file   Highest education level: Not on file  Occupational History   Not on file  Tobacco Use   Smoking status: Former    Packs/day: 0.50    Types: Cigarettes   Smokeless tobacco: Never  Vaping Use   Vaping Use: Every day  Substance and Sexual Activity   Alcohol use: No    Comment: Social    Drug use: No   Sexual activity: Not Currently    Birth control/protection: Pill  Other Topics Concern   Not on file  Social History Narrative   Not on file   Social Determinants of Health   Financial  Resource Strain: Not on file  Food Insecurity: Not on file  Transportation Needs: Not on file  Physical Activity: Not on file  Stress: Not on file  Social Connections: Not on file  Intimate Partner Violence: Not At Risk   Fear of Current or Ex-Partner: No   Emotionally Abused: No   Physically Abused: No   Sexually Abused: No    Allergies:  Allergies  Allergen Reactions   Morphine And Related     Immediate vein hypersensitivity, swelling, redness and rash.    No Known Allergies Other (See Comments)    Medications: Prior to Admission medications   Medication Sig Start Date End Date Taking? Authorizing Provider  Prenatal Vit-Fe Fumarate-FA (MULTIVITAMIN-PRENATAL) 27-0.8 MG TABS tablet Take 1 tablet by mouth daily at 12 noon.    [provider]    Physical Exam Blood pressure 124/78, height 5\' 7"  (1.702 m), weight 137 lb (62.1 kg), unknown if currently breastfeeding.  No LMP recorded.  General: NAD HEENT: normocephalic, anicteric Pulmonary: No increased work of breathing Cardiovascular: RRR, distal pulses 2+ Extremities: edema, no erythema, no tenderness Neurologic: Grossly intact Psychiatric: mood appropriate, affect full  Assessment: 30 y.o. 26 presenting for blood pressure evaluation today  Plan: Problem List Items Addressed This Visit   None   1) Blood pressure - blood pressure at today's visit  is normotensive.  As a result antihypertensive therapy is currently not warranted. - additional blood work was not obtained  2) Post BTL - 30 y.o. [id number]  with undesired fertility, desires permanent sterilization.  Other reversible forms of contraception were discussed with patient; she declines all other modalities. Permanent nature of as well as associated risks of the procedure discussed with patient including but not limited to: risk of regret, permanence of method, bleeding, infection, injury to surrounding organs and need for additional procedures.   Failure risk of 0.5-1% with increased risk of ectopic gestation if pregnancy occurs was also discussed with patient.   - desires salpingectomy   Vena Austria, MD, [facility], [facility] 03/29/2021, 11:40 AM

## 2021-04-16 ENCOUNTER — Encounter
Admission: RE | Admit: 2021-04-16 | Discharge: 2021-04-16 | Disposition: A | Discharge status: Home / Self Care | Payer: Medicaid Other | Source: Ambulatory Visit | Attending: Obstetrics & Gynecology | Admitting: Obstetrics & Gynecology

## 2021-04-16 ENCOUNTER — Other Ambulatory Visit: Payer: Self-pay

## 2021-04-16 HISTORY — DX: Anxiety disorder, unspecified: F41.9

## 2021-04-16 NOTE — Patient Instructions (Addendum)
Your procedure is scheduled on: Thurs 9/15 Report to Day Surgery.  Medical Mall registration desk  To find out your arrival time please call [phone] between 1PM - 3PM on Wed. 9/14.  Remember: Instructions that are not followed completely may result in serious medical risk,  up to and including death, or upon the discretion of your surgeon and anesthesiologist your  surgery may need to be rescheduled.     _X__ 1. Do not eat food after midnight the night before your procedure.                 No chewing gum or hard candies. You may drink clear liquids up to 2 hours                 before you are scheduled to arrive for your surgery- DO not drink clear                 liquids within 2 hours of the start of your surgery.                 Clear Liquids include:  water, apple juice without pulp, clear Gatorade, G2 or                  Gatorade Zero (avoid Red/Purple/Blue), Black Coffee or Tea (Do not add                 anything to coffee or tea).  __X__2.  On the morning of surgery brush your teeth with toothpaste and water, you                may rinse your mouth with mouthwash if you wish.  Do not swallow any toothpaste of mouthwash.     _X__ 3.  No Alcohol for 24 hours before or after surgery.   _X__ 4.  Do Not Smoke or use e-cigarettes For 24 Hours Prior to Your Surgery.                 Do not use any chewable tobacco products for at least 6 hours prior to                 Surgery.  ___  5.  Do not use any recreational drugs (marijuana, cocaine, heroin, ecstasy, MDMA or other) For at least one week prior to your surgery.    Combination of these drugs with anesthesia may have life threatening results.  ____  6.  Bring all medications with you on the day of surgery if instructed.   __x__  7.  Notify your doctor if there is any change in your medical condition      (cold, fever, infections).     Do not wear jewelry, make-up, hairpins, clips or nail polish. Do  not wear lotions, powders, or perfumes. You may wear deodorant. Do not shave 48 hours prior to surgery.  Do not bring valuables to the hospital.    [facility] is not responsible for any belongings or valuables.  Contacts, dentures or bridgework may not be worn into surgery. Leave your suitcase in the car. After surgery it may be brought to your room. For patients admitted to the hospital, discharge time is determined by your treatment team.   Patients discharged the day of surgery will not be allowed to drive home.   Make arrangements for someone to be with you for the first 24 hours of your Same Day Discharge.    Please read  over the following fact sheets that you were given:   Incentive spirometer    ____ Take these medicines the morning of surgery with A SIP OF WATER:    1. none  2.   3.   4.  5.  6.  ____ Fleet Enema (as directed)   __x__ Use CHG Soap (or wipes) as directed  ____ Use Benzoyl Peroxide Gel as instructed  ____ Use inhalers on the day of surgery  ____ Stop metformin 2 days prior to surgery    ____ Take 1/2 of usual insulin dose the night before surgery. No insulin the morning          of surgery.   ____ Stop Coumadin/Plavix/aspirin on   __x__ Stop Anti-inflammatories no ibuprofen aleve or aspirin May take tylenol   ____ Stop supplements until after surgery.    ____ Bring C-Pap to the hospital.    If you have any questions regarding your pre-procedure instructions,  Please call Pre-admit Testing at [phone]

## 2021-04-22 ENCOUNTER — Other Ambulatory Visit: Payer: Self-pay | Admitting: Obstetrics & Gynecology

## 2021-04-22 ENCOUNTER — Ambulatory Visit: Payer: Medicaid Other | Admitting: Obstetrics & Gynecology

## 2021-04-22 ENCOUNTER — Other Ambulatory Visit
Admission: RE | Admit: 2021-04-22 | Discharge: 2021-04-22 | Disposition: A | Discharge status: Home / Self Care | Payer: Medicaid Other | Source: Ambulatory Visit | Attending: Obstetrics & Gynecology | Admitting: Obstetrics & Gynecology

## 2021-04-22 ENCOUNTER — Other Ambulatory Visit: Payer: Self-pay

## 2021-04-22 DIAGNOSIS — Z01812 Encounter for preprocedural laboratory examination: Secondary | ICD-10-CM | POA: Diagnosis present

## 2021-04-22 LAB — CBC
HCT: 37.4 % (ref 36.0–46.0)
Hemoglobin: 11.8 g/dL — ABNORMAL LOW (ref 12.0–15.0)
MCH: 28 pg (ref 26.0–34.0)
MCHC: 31.6 g/dL (ref 30.0–36.0)
MCV: 88.8 fL (ref 80.0–100.0)
Platelets: 181 10*3/uL (ref 150–400)
RBC: 4.21 MIL/uL (ref 3.87–5.11)
RDW: 13.4 % (ref 11.5–15.5)
WBC: 6.9 10*3/uL (ref 4.0–10.5)
nRBC: 0 % (ref 0.0–0.2)

## 2021-04-22 LAB — TYPE AND SCREEN
ABO/RH(D): O POS
Antibody Screen: NEGATIVE
Extend sample reason: UNDETERMINED

## 2021-04-22 NOTE — Progress Notes (Signed)
Pt did not show here for appt, but did get lab work w plans for surgery.  I can get consent at hospital, if it is her plan still to have surgery (tubal)

## 2021-04-25 ENCOUNTER — Ambulatory Visit: Payer: Medicaid Other | Admitting: Urgent Care

## 2021-04-25 ENCOUNTER — Ambulatory Visit
Admission: RE | Admit: 2021-04-25 | Discharge: 2021-04-25 | Disposition: A | Discharge status: Home / Self Care | Payer: Medicaid Other | Source: Ambulatory Visit | Attending: Obstetrics & Gynecology | Admitting: Obstetrics & Gynecology

## 2021-04-25 ENCOUNTER — Ambulatory Visit: Payer: Medicaid Other | Admitting: Anesthesiology

## 2021-04-25 ENCOUNTER — Encounter
Admission: RE | Disposition: A | Discharge status: Home / Self Care | Payer: Self-pay | Source: Ambulatory Visit | Attending: Obstetrics & Gynecology

## 2021-04-25 ENCOUNTER — Encounter: Payer: Self-pay | Admitting: Obstetrics & Gynecology

## 2021-04-25 DIAGNOSIS — Z87891 Personal history of nicotine dependence: Secondary | ICD-10-CM | POA: Insufficient documentation

## 2021-04-25 DIAGNOSIS — Z885 Allergy status to narcotic agent status: Secondary | ICD-10-CM | POA: Diagnosis not present

## 2021-04-25 DIAGNOSIS — Z9049 Acquired absence of other specified parts of digestive tract: Secondary | ICD-10-CM | POA: Insufficient documentation

## 2021-04-25 DIAGNOSIS — Z302 Encounter for sterilization: Secondary | ICD-10-CM | POA: Insufficient documentation

## 2021-04-25 HISTORY — PX: LAPAROSCOPIC BILATERAL SALPINGECTOMY: SHX5889

## 2021-04-25 LAB — POCT PREGNANCY, URINE: Preg Test, Ur: NEGATIVE

## 2021-04-25 SURGERY — SALPINGECTOMY, BILATERAL, LAPAROSCOPIC
Anesthesia: General | Laterality: Bilateral

## 2021-04-25 MED ORDER — FENTANYL CITRATE (PF) 100 MCG/2ML IJ SOLN
INTRAMUSCULAR | Status: DC | PRN
  Administered 2021-04-25 (×2): 50 ug via INTRAVENOUS
  Administered 2021-04-25: 100 ug via INTRAVENOUS

## 2021-04-25 MED ORDER — FENTANYL CITRATE (PF) 100 MCG/2ML IJ SOLN
INTRAMUSCULAR | Status: AC
  Filled 2021-04-25: qty 2

## 2021-04-25 MED ORDER — FENTANYL CITRATE (PF) 100 MCG/2ML IJ SOLN: 25.0000 ug | INTRAMUSCULAR | Status: DC | PRN

## 2021-04-25 MED ORDER — BUPIVACAINE HCL (PF) 0.5 % IJ SOLN
INTRAMUSCULAR | Status: AC
  Filled 2021-04-25: qty 30

## 2021-04-25 MED ORDER — FAMOTIDINE 20 MG PO TABS
ORAL_TABLET | ORAL | Status: AC
  Filled 2021-04-25: qty 1

## 2021-04-25 MED ORDER — MEPERIDINE HCL 25 MG/ML IJ SOLN: 6.2500 mg | INTRAMUSCULAR | Status: DC | PRN

## 2021-04-25 MED ORDER — 0.9 % SODIUM CHLORIDE (POUR BTL) OPTIME
TOPICAL | Status: DC | PRN
  Administered 2021-04-25: 100 mL

## 2021-04-25 MED ORDER — ROCURONIUM BROMIDE 100 MG/10ML IV SOLN
INTRAVENOUS | Status: DC | PRN
  Administered 2021-04-25: 50 mg via INTRAVENOUS

## 2021-04-25 MED ORDER — LACTATED RINGERS IV SOLN: INTRAVENOUS | Status: DC

## 2021-04-25 MED ORDER — OXYCODONE HCL 5 MG PO TABS
5.0000 mg | ORAL_TABLET | Freq: Once | ORAL | Status: AC | PRN
  Administered 2021-04-25: 5 mg via ORAL

## 2021-04-25 MED ORDER — LIDOCAINE HCL (CARDIAC) PF 100 MG/5ML IV SOSY
PREFILLED_SYRINGE | INTRAVENOUS | Status: DC | PRN
  Administered 2021-04-25: 80 mg via INTRAVENOUS

## 2021-04-25 MED ORDER — PROPOFOL 10 MG/ML IV BOLUS
INTRAVENOUS | Status: DC | PRN
  Administered 2021-04-25: 150 mg via INTRAVENOUS

## 2021-04-25 MED ORDER — FENTANYL CITRATE PF 50 MCG/ML IJ SOSY: 25.0000 ug | PREFILLED_SYRINGE | INTRAMUSCULAR | Status: DC | PRN

## 2021-04-25 MED ORDER — DEXAMETHASONE SODIUM PHOSPHATE 10 MG/ML IJ SOLN
INTRAMUSCULAR | Status: DC | PRN
  Administered 2021-04-25: 10 mg via INTRAVENOUS

## 2021-04-25 MED ORDER — ONDANSETRON HCL 4 MG/2ML IJ SOLN
INTRAMUSCULAR | Status: DC | PRN
  Administered 2021-04-25: 4 mg via INTRAVENOUS

## 2021-04-25 MED ORDER — ORAL CARE MOUTH RINSE: 15.0000 mL | Freq: Once | OROMUCOSAL | Status: AC

## 2021-04-25 MED ORDER — OXYCODONE-ACETAMINOPHEN 5-325 MG PO TABS: 1.0000 | ORAL_TABLET | ORAL | 0 refills | Status: DC | PRN

## 2021-04-25 MED ORDER — ACETAMINOPHEN 10 MG/ML IV SOLN
INTRAVENOUS | Status: AC
  Filled 2021-04-25: qty 100

## 2021-04-25 MED ORDER — POVIDONE-IODINE 10 % EX SWAB: 2.0000 "application " | Freq: Once | CUTANEOUS | Status: DC

## 2021-04-25 MED ORDER — OXYCODONE HCL 5 MG PO TABS
ORAL_TABLET | ORAL | Status: AC
  Filled 2021-04-25: qty 1

## 2021-04-25 MED ORDER — ACETAMINOPHEN 10 MG/ML IV SOLN
INTRAVENOUS | Status: DC | PRN
  Administered 2021-04-25: 1000 mg via INTRAVENOUS

## 2021-04-25 MED ORDER — CHLORHEXIDINE GLUCONATE 0.12 % MT SOLN
OROMUCOSAL | Status: AC
  Filled 2021-04-25: qty 15

## 2021-04-25 MED ORDER — BUPIVACAINE HCL (PF) 0.5 % IJ SOLN
INTRAMUSCULAR | Status: DC | PRN
  Administered 2021-04-25: 7 mL

## 2021-04-25 MED ORDER — SUGAMMADEX SODIUM 200 MG/2ML IV SOLN
INTRAVENOUS | Status: DC | PRN
  Administered 2021-04-25: 200 mg via INTRAVENOUS

## 2021-04-25 MED ORDER — ACETAMINOPHEN 650 MG RE SUPP
650.0000 mg | RECTAL | Status: DC | PRN
  Filled 2021-04-25: qty 1

## 2021-04-25 MED ORDER — FENTANYL CITRATE (PF) 100 MCG/2ML IJ SOLN
INTRAMUSCULAR | Status: AC
  Administered 2021-04-25: 25 ug via INTRAVENOUS
  Filled 2021-04-25: qty 2

## 2021-04-25 MED ORDER — MIDAZOLAM HCL 2 MG/2ML IJ SOLN
INTRAMUSCULAR | Status: AC
  Filled 2021-04-25: qty 2

## 2021-04-25 MED ORDER — OXYCODONE HCL 5 MG/5ML PO SOLN
5.0000 mg | Freq: Once | ORAL | Status: AC | PRN
Start: 2021-04-25 — End: 2021-04-25

## 2021-04-25 MED ORDER — MIDAZOLAM HCL 2 MG/2ML IJ SOLN
INTRAMUSCULAR | Status: DC | PRN
  Administered 2021-04-25: 2 mg via INTRAVENOUS

## 2021-04-25 MED ORDER — ACETAMINOPHEN 325 MG PO TABS: 650.0000 mg | ORAL_TABLET | ORAL | Status: DC | PRN

## 2021-04-25 MED ORDER — ONDANSETRON HCL 4 MG/2ML IJ SOLN: 4.0000 mg | Freq: Once | INTRAMUSCULAR | Status: DC | PRN

## 2021-04-25 MED ORDER — CHLORHEXIDINE GLUCONATE 0.12 % MT SOLN
15.0000 mL | Freq: Once | OROMUCOSAL | Status: AC
  Administered 2021-04-25: 15 mL via OROMUCOSAL

## 2021-04-25 MED ORDER — KETOROLAC TROMETHAMINE 30 MG/ML IJ SOLN
INTRAMUSCULAR | Status: DC | PRN
  Administered 2021-04-25: 30 mg via INTRAVENOUS

## 2021-04-25 MED ORDER — FAMOTIDINE 20 MG PO TABS
20.0000 mg | ORAL_TABLET | Freq: Once | ORAL | Status: AC
  Administered 2021-04-25: 20 mg via ORAL

## 2021-04-25 MED ORDER — GLYCOPYRROLATE 0.2 MG/ML IJ SOLN
INTRAMUSCULAR | Status: DC | PRN
  Administered 2021-04-25: .2 mg via INTRAVENOUS

## 2021-04-25 MED ORDER — OXYCODONE-ACETAMINOPHEN 5-325 MG PO TABS
1.0000 | ORAL_TABLET | ORAL | Status: DC | PRN
Start: 2021-04-25 — End: 2021-04-25

## 2021-04-25 MED ORDER — LACTATED RINGERS IV SOLN: INTRAVENOUS | Status: DC | PRN

## 2021-04-25 SURGICAL SUPPLY — 34 items
ADH SKN CLS APL DERMABOND .7 (GAUZE/BANDAGES/DRESSINGS) ×1
APL PRP STRL LF DISP 70% ISPRP (MISCELLANEOUS) ×1
BLADE SURG SZ11 CARB STEEL (BLADE) ×2 IMPLANT
CATH ROBINSON RED A/P 16FR (CATHETERS) ×2 IMPLANT
CHLORAPREP W/TINT 26 (MISCELLANEOUS) ×2 IMPLANT
DERMABOND ADVANCED (GAUZE/BANDAGES/DRESSINGS) ×1
DERMABOND ADVANCED .7 DNX12 (GAUZE/BANDAGES/DRESSINGS) ×1 IMPLANT
DRSG TEGADERM 2-3/8X2-3/4 SM (GAUZE/BANDAGES/DRESSINGS) ×4 IMPLANT
GAUZE 4X4 16PLY ~~LOC~~+RFID DBL (SPONGE) ×4 IMPLANT
GLOVE SURG ENC MOIS LTX SZ8 (GLOVE) ×4 IMPLANT
GLOVE SURG UNDER LTX SZ8 (GLOVE) ×2 IMPLANT
GOWN STRL REUS W/ TWL LRG LVL3 (GOWN DISPOSABLE) ×1 IMPLANT
GOWN STRL REUS W/ TWL XL LVL3 (GOWN DISPOSABLE) ×1 IMPLANT
GOWN STRL REUS W/TWL LRG LVL3 (GOWN DISPOSABLE) ×2
GOWN STRL REUS W/TWL XL LVL3 (GOWN DISPOSABLE) ×2
GRASPER SUT TROCAR 14GX15 (MISCELLANEOUS) ×2 IMPLANT
KIT PINK PAD W/HEAD ARE REST (MISCELLANEOUS) ×2
KIT PINK PAD W/HEAD ARM REST (MISCELLANEOUS) ×1 IMPLANT
LABEL OR SOLS (LABEL) ×2 IMPLANT
MANIFOLD NEPTUNE II (INSTRUMENTS) ×2 IMPLANT
NEEDLE VERESS 14GA 120MM (NEEDLE) ×2 IMPLANT
NS IRRIG 500ML POUR BTL (IV SOLUTION) ×2 IMPLANT
PACK GYN LAPAROSCOPIC (MISCELLANEOUS) ×2 IMPLANT
PAD PREP 24X41 OB/GYN DISP (PERSONAL CARE ITEMS) ×2 IMPLANT
SCRUB EXIDINE 4% CHG 4OZ (MISCELLANEOUS) ×2 IMPLANT
SET TUBE SMOKE EVAC HIGH FLOW (TUBING) ×2 IMPLANT
SHEARS HARMONIC ACE PLUS 36CM (ENDOMECHANICALS) ×2 IMPLANT
SOL PREP PROV IODINE SCRUB 4OZ (MISCELLANEOUS) ×2 IMPLANT
SPONGE GAUZE 2X2 8PLY STRL LF (GAUZE/BANDAGES/DRESSINGS) ×2 IMPLANT
STRAP SAFETY 5IN WIDE (MISCELLANEOUS) ×2 IMPLANT
SUT VIC AB 0 CT1 36 (SUTURE) ×2 IMPLANT
SYR 10ML LL (SYRINGE) ×2 IMPLANT
TROCAR XCEL NON-BLD 5MMX100MML (ENDOMECHANICALS) ×2 IMPLANT
WATER STERILE IRR 500ML POUR (IV SOLUTION) ×2 IMPLANT

## 2021-04-25 NOTE — Anesthesia Postprocedure Evaluation (Signed)
Anesthesia Post Note  Patient: Tracie Meyer  Procedure(s) Performed: LAPAROSCOPIC BILATERAL SALPINGECTOMY (Bilateral)  Patient location during evaluation: PACU Anesthesia Type: General Level of consciousness: awake and alert Pain management: pain level controlled Vital Signs Assessment: post-procedure vital signs reviewed and stable Respiratory status: spontaneous breathing, nonlabored ventilation, respiratory function stable and patient connected to nasal cannula oxygen Cardiovascular status: blood pressure returned to baseline and stable Postop Assessment: no apparent nausea or vomiting Anesthetic complications: no   No notable events documented.   Last Vitals:  Vitals:   04/25/21 1239 04/25/21 1252  BP: 101/66 109/74  Pulse: 70 60  Resp: 12 18  Temp: 36.4 C (!) 36.3 C  SpO2: 100% 99%    Last Pain:  Vitals:   04/25/21 1252  TempSrc: Temporal  PainSc: 0-No pain                 [name] Michae Kava

## 2021-04-25 NOTE — Anesthesia Preprocedure Evaluation (Signed)
Anesthesia Evaluation  Patient identified by MRN, date of birth, ID band Patient awake    Reviewed: Allergy & Precautions, NPO status , Patient's Chart, lab work & pertinent test results, reviewed documented beta blocker date and time   History of Anesthesia Complications Negative for: history of anesthetic complications  Airway Mallampati: I  TM Distance: >3 FB Neck ROM: Full    Dental no notable dental hx.    Pulmonary former smoker,    Pulmonary exam normal        Cardiovascular Exercise Tolerance: Good Normal cardiovascular examI     Neuro/Psych    GI/Hepatic   Endo/Other    Renal/GU      Musculoskeletal   Abdominal Normal abdominal exam  (+)   Peds  Hematology   Anesthesia Other Findings   Reproductive/Obstetrics                             Anesthesia Physical Anesthesia Plan  ASA: 1  Anesthesia Plan: General   Post-op Pain Management:    Induction: Intravenous  PONV Risk Score and Plan:   Airway Management Planned: Oral ETT  Additional Equipment: None  Intra-op Plan:   Post-operative Plan: Extubation in OR  Informed Consent: I have reviewed the patients History and Physical, chart, labs and discussed the procedure including the risks, benefits and alternatives for the proposed anesthesia with the patient or authorized representative who has indicated his/her understanding and acceptance.       Plan Discussed with: CRNA  Anesthesia Plan Comments:         Anesthesia Quick Evaluation

## 2021-04-25 NOTE — Op Note (Signed)
  Operative Note   04/25/2021  PRE-OP DIAGNOSIS: Desire for permanent sterilization  POST-OP DIAGNOSIS: same   PROCEDURE: Procedure(s): LAPAROSCOPIC BILATERAL SALPINGECTOMY   SURGEON: Annamarie Major, MD, FACOG  ANESTHESIA: Choice   ESTIMATED BLOOD LOSS: Min  COMPLICATIONS: None  DISPOSITION: PACU - hemodynamically stable.  CONDITION: stable  FINDINGS: Laparoscopic survey of the abdomen revealed a grossly normal uterus, tubes, ovaries, liver edge, gallbladder edge and absent appendix, No intra-abdominal adhesions were noted.  PROCEDURE IN DETAIL: The patient was taken to the OR where anesthesia was administed. The patient was positioned in dorsal lithotomy in the [place] stirrups. The patient was then examined under anesthesia with the above noted findings. The patient was prepped and draped in the normal sterile fashion and bladder was drained using a red rubber cathater. Speculum exam normal, and a Hulka tenaculum was placed for manipulation purposes.  Attention was turned to the patient's abdomen where a 5 mm skin incision was made in the umbilical fold, after injection of local anesthesia. The Veress step needle was carefully introduced into the peritoneal cavity with placement confirmed using the hanging drop technique.  Pneumoperitoneum was obtained. The 5 mm port was then placed under direct visualization with the operative laparoscope  The above noted findings.  Trendelenburg.  A 5 mm trocar was then placed in the right lower quadrant under direct visualization with the laparoscope.  Right and left fallopian tubes are identified and followed out to their fimbria.  Each tube is excised utilizing the Harmonic scapel to include the fibria.  No injuries or bleeding was noted.  All instruments and ports were then removed from the abdomen after gas was expelled and patient was leveled.   The skin was closed with skin adhesive. The patient tolerated the procedure well. All counts were  correct x 2. The patient was transferred to the recovery room awake, alert and breathing independently.  Annamarie Major, MD, [facility], [facility] 04/25/2021  11:43 AM

## 2021-04-25 NOTE — Transfer of Care (Signed)
Immediate Anesthesia Transfer of Care Note  Patient: Tracie Meyer  Procedure(s) Performed: LAPAROSCOPIC BILATERAL SALPINGECTOMY (Bilateral)  Patient Location: PACU  Anesthesia Type:General  Level of Consciousness: awake and alert   Airway & Oxygen Therapy: Patient Spontanous Breathing  Post-op Assessment: Report given to RN  Post vital signs: stable  Last Vitals:  Vitals Value Taken Time  BP    Temp    Pulse    Resp    SpO2      Last Pain:  Vitals:   04/25/21 0946  TempSrc: Temporal  PainSc: 0-No pain         Complications: No notable events documented.

## 2021-04-25 NOTE — Anesthesia Procedure Notes (Signed)
Procedure Name: Intubation Date/Time: 04/25/2021 10:54 AM Performed by: Cathleen Corti, MD Pre-anesthesia Checklist: Patient identified, Patient being monitored, Timeout performed, Emergency Drugs available and Suction available Patient Re-evaluated:Patient Re-evaluated prior to induction Oxygen Delivery Method: Circle system utilized Preoxygenation: Pre-oxygenation with 100% oxygen Induction Type: IV induction Ventilation: Mask ventilation without difficulty Laryngoscope Size: 3 and McGraph Grade View: Grade I Tube type: Oral Tube size: 7.0 mm Number of attempts: 1 Airway Equipment and Method: Stylet Placement Confirmation: ETT inserted through vocal cords under direct vision, positive ETCO2 and breath sounds checked- equal and bilateral Secured at: 22 cm Tube secured with: Tape Dental Injury: Teeth and Oropharynx as per pre-operative assessment

## 2021-04-25 NOTE — H&P (Signed)
PRE-OPERATIVE HISTORY AND PHYSICAL EXAM  HPI:  Tracie Meyer is a 30 y.o. [phone] Patient's last menstrual period was 04/01/2021 (approximate).; she is being admitted for surgery related to requested sterilization.  PMHx: Past Medical History:  Diagnosis Date   Anxiety    History of fainting spells of unknown cause    Past Surgical History:  Procedure Laterality Date   APPENDECTOMY  08/12/2011   VAGINAL DELIVERY     x3   Family History  Problem Relation Age of Onset   Healthy Mother    Testicular cancer Father    Cancer Maternal Grandmother        pancreatic   Depression Maternal Grandfather    Social History   Tobacco Use   Smoking status: Former    Packs/day: 0.50    Types: Cigarettes   Smokeless tobacco: Never  Vaping Use   Vaping Use: Every day   Substances: Nicotine, Flavoring  Substance Use Topics   Alcohol use: Yes    Comment: Social    Drug use: No    Current Facility-Administered Medications:    chlorhexidine (PERIDEX) 0.12 % solution, , , ,    famotidine (PEPCID) 20 MG tablet, , , ,    lactated ringers infusion, , Intravenous, Continuous, Nadara Mustard, MD, Last Rate: 999 mL/hr at 04/25/21 0952, New Bag at 04/25/21 0952   povidone-iodine 10 % swab 2 application, 2 application, Topical, Once, Dustie Brittle, Harrel Lemon, MD Allergies: Morphine and related  Review of Systems  Constitutional:  Negative for chills, fever and malaise/fatigue.  HENT:  Negative for congestion, sinus pain and sore throat.   Eyes:  Negative for blurred vision and pain.  Respiratory:  Negative for cough and wheezing.   Cardiovascular:  Negative for chest pain and leg swelling.  Gastrointestinal:  Negative for abdominal pain, constipation, diarrhea, heartburn, nausea and vomiting.  Genitourinary:  Negative for dysuria, frequency, hematuria and urgency.  Musculoskeletal:  Negative for back pain, joint pain, myalgias and neck pain.  Skin:  Negative for itching and rash.   Neurological:  Negative for dizziness, tremors and weakness.  Endo/Heme/Allergies:  Does not bruise/bleed easily.  Psychiatric/Behavioral:  Negative for depression. The patient is not nervous/anxious and does not have insomnia.    Objective: BP 113/70   Pulse (!) 54   Temp 97.8 F (36.6 C) (Temporal)   Resp 14   LMP 04/01/2021 (Approximate)   SpO2 98%  There were no vitals filed for this visit. Physical Exam Constitutional:      General: She is not in acute distress.    Appearance: She is well-developed.  HENT:     Head: Normocephalic and atraumatic. No laceration.     Right Ear: Hearing normal.     Left Ear: Hearing normal.     Mouth/Throat:     Pharynx: Uvula midline.  Eyes:     Pupils: Pupils are equal, round, and reactive to light.  Neck:     Thyroid: No thyromegaly.  Cardiovascular:     Rate and Rhythm: Normal rate and regular rhythm.     Heart sounds: No murmur heard.   No friction rub. No gallop.  Pulmonary:     Effort: Pulmonary effort is normal. No respiratory distress.     Breath sounds: Normal breath sounds. No wheezing.  Abdominal:     General: Bowel sounds are normal. There is no distension.     Palpations: Abdomen is soft.     Tenderness: There is no abdominal tenderness. There is  no rebound.  Musculoskeletal:        General: Normal range of motion.     Cervical back: Normal range of motion and neck supple.  Neurological:     Mental Status: She is alert and oriented to person, place, and time.     Cranial Nerves: No cranial nerve deficit.  Skin:    General: Skin is warm and dry.  Psychiatric:        Judgment: Judgment normal.  Vitals reviewed.    Assessment: Desire for sterilization Three children, does not desire future pregnancy Understands permanence of procedure. Plan laparoscopic partial bilateral salpingectomy  The patient has been fully informed about all methods of contraception, both temporary and permanent. She understands that tubal  ligation is meant to be permanent, absolute and irreversible. She was told that there is an approximately 1 in 400 chance of a pregnancy in the future after tubal ligation. She was told the short and long term complications of tubal ligation. She understands the risks from this surgery include, but are not limited to, the risks of anesthesia, hemorrhage, infection, perforation, and injury to adjacent structures, bowel, bladder and blood vessels.   Annamarie Major, MD, [facility], [facility] 04/25/2021  10:29 AM

## 2021-04-26 ENCOUNTER — Telehealth: Payer: Self-pay

## 2021-04-26 LAB — SURGICAL PATHOLOGY

## 2021-04-26 NOTE — Telephone Encounter (Signed)
Pt aware.

## 2021-04-26 NOTE — Telephone Encounter (Signed)
It is possible she is having reaction to one of the medicines from the hospital.  Take Benadryl to help w sx's.  Hold pain medicine if she can and just take advil.

## 2021-04-26 NOTE — Telephone Encounter (Signed)
Pt calling reporting that she has a sore throat and cheeks are red and splotchy wondering if she is having a reaction from the medication? Pt had tubal yesterday.

## 2021-12-21 ENCOUNTER — Emergency Department: Payer: Medicaid Other

## 2021-12-21 ENCOUNTER — Encounter: Payer: Self-pay | Admitting: Emergency Medicine

## 2021-12-21 ENCOUNTER — Other Ambulatory Visit: Payer: Self-pay

## 2021-12-21 DIAGNOSIS — U071 COVID-19: Secondary | ICD-10-CM | POA: Diagnosis not present

## 2021-12-21 DIAGNOSIS — Z28310 Unvaccinated for covid-19: Secondary | ICD-10-CM | POA: Diagnosis not present

## 2021-12-21 DIAGNOSIS — R509 Fever, unspecified: Secondary | ICD-10-CM | POA: Diagnosis present

## 2021-12-21 NOTE — ED Triage Notes (Signed)
Pt to ED from home c/o productive green cough and fever of 103 since yesterday.  Last took tylenol around 1930 tonight. ?

## 2021-12-22 ENCOUNTER — Emergency Department
Admission: EM | Admit: 2021-12-22 | Discharge: 2021-12-22 | Disposition: A | Discharge status: Home / Self Care | Payer: Medicaid Other | Attending: Emergency Medicine | Admitting: Emergency Medicine

## 2021-12-22 DIAGNOSIS — U071 COVID-19: Secondary | ICD-10-CM

## 2021-12-22 LAB — RESP PANEL BY RT-PCR (FLU A&B, COVID) ARPGX2
Influenza A by PCR: NEGATIVE
Influenza B by PCR: NEGATIVE
SARS Coronavirus 2 by RT PCR: POSITIVE — AB

## 2021-12-22 MED ORDER — NIRMATRELVIR/RITONAVIR (PAXLOVID)TABLET: 3.0000 | ORAL_TABLET | Freq: Two times a day (BID) | ORAL | 0 refills | Status: AC

## 2021-12-22 NOTE — ED Provider Notes (Signed)
? ?[facility] ?Provider Note ? ? ? Event Date/Time  ? First MD Initiated Contact with Patient 12/22/21 0120   ?  (approximate) ? ? ?History  ? ?Fever and Cough ? ? ?HPI ? ?Tracie Meyer is a 31 y.o. female with no chronic medical issues who presents for evaluation of a couple of days of body aches, intermittent fever, and mild cough.  No known sick contacts.  No significant difficulty breathing.  Fever up to 103 at home but controlled with Tylenol.  No chest pain or abdominal pain.  Patient has not taken the COVID-19 vaccination and she had COVID several years ago. ?  ? ? ?Physical Exam  ? ?Triage Vital Signs: ?ED Triage Vitals  ?Enc Vitals Group  ?   BP 12/21/21 2224 (!) 134/59  ?   Pulse Rate 12/21/21 2224 95  ?   Resp 12/21/21 2224 17  ?   Temp 12/21/21 2224 98.2 ?F (36.8 ?C)  ?   Temp Source 12/22/21 0050 Oral  ?   SpO2 12/21/21 2224 95 %  ?   Weight 12/21/21 2225 54.4 kg (120 lb)  ?   Height 12/21/21 2225 1.702 m (5\' 7" )  ?   Head Circumference --   ?   Peak Flow --   ?   Pain Score 12/21/21 2224 0  ?   Pain Loc --   ?   Pain Edu? --   ?   Excl. in GC? --   ? ? ?Most recent vital signs: ?Vitals:  ? 12/21/21 2224 12/22/21 0050  ?BP: (!) 134/59 110/71  ?Pulse: 95 80  ?Resp: 17 18  ?Temp: 98.2 ?F (36.8 ?C) (!) 100.6 ?F (38.1 ?C)  ?SpO2: 95% 99%  ? ? ? ?General: Awake, no distress.  ?CV:  Good peripheral perfusion.  ?Resp:  Normal effort.  Lungs are clear to auscultation bilaterally.  No cough observed during exam. ?Abd:  No distention.  ? ? ?ED Results / Procedures / Treatments  ? ?Labs ?(all labs ordered are listed, but only abnormal results are displayed) ?Labs Reviewed  ?RESP PANEL BY RT-PCR (FLU A&B, COVID) ARPGX2 - Abnormal; Notable for the following components:  ?    Result Value  ? SARS Coronavirus 2 by RT PCR POSITIVE (*)   ? All other components within normal limits  ? ? ? ?RADIOLOGY ?I personally viewed and interpreted the patient's two-view chest x-ray and I see no evidence of  pneumonia or other acute abnormality ? ? ? ?PROCEDURES: ? ?Critical Care performed: No ? ?Procedures ? ? ?MEDICATIONS ORDERED IN ED: ?Medications - No data to display ? ? ?IMPRESSION / MDM / ASSESSMENT AND PLAN / ED COURSE  ?I reviewed the triage vital signs and the nursing notes. ?             ?               ? ?Differential diagnosis includes, but is not limited to, viral infection, community-acquired pneumonia, bronchitis. ? ?Vital signs are stable and within normal limits other than a mild fever.  Chest x-ray normal as documented above.  A respiratory viral panel was ordered and I interpreted the results.  She is positive for COVID-19. ? ?Medication for blood work.  Her symptoms are relatively mild and she is well-appearing.  We talked about it and she is willing to take a prescription for paxlovid and she has no history of renal dysfunction.  I reviewed her labs in  the computer and we do not have any within the last few years but her creatinine was normal at that time she is young and otherwise healthy.  I had my usual and customary risk and benefits discussion regarding paxlovid with her and she will consider whether or not to fill the prescription.  I gave my usual customary follow-up recommendations and management recommendations at home.  I also gave her return precautions. ? ? ? ? ?  ? ? ?FINAL CLINICAL IMPRESSION(S) / ED DIAGNOSES  ? ?Final diagnoses:  ?COVID-19  ? ? ? ?Rx / DC Orders  ? ?ED Discharge Orders   ? ?      Ordered  ?  nirmatrelvir/ritonavir EUA (PAXLOVID) 20 x 150 MG & 10 x 100MG  TABS  2 times daily       ? 12/22/21 0237  ? ?  ?  ? ?  ? ? ? ?Note:  This document was prepared using Dragon voice recognition software and may include unintentional dictation errors. ?  ?12/24/21, MD ?12/22/21 [phone] ? ?

## 2022-07-17 ENCOUNTER — Encounter: Payer: Self-pay | Admitting: Internal Medicine

## 2022-07-17 ENCOUNTER — Ambulatory Visit: Payer: Medicaid Other | Admitting: Internal Medicine

## 2022-07-17 VITALS — BP 114/68 | HR 74 | Temp 97.7°F | Ht 67.0 in | Wt 118.0 lb

## 2022-07-17 DIAGNOSIS — F32A Depression, unspecified: Secondary | ICD-10-CM | POA: Insufficient documentation

## 2022-07-17 DIAGNOSIS — R55 Syncope and collapse: Secondary | ICD-10-CM

## 2022-07-17 DIAGNOSIS — Z136 Encounter for screening for cardiovascular disorders: Secondary | ICD-10-CM

## 2022-07-17 DIAGNOSIS — F419 Anxiety disorder, unspecified: Secondary | ICD-10-CM | POA: Diagnosis not present

## 2022-07-17 DIAGNOSIS — R636 Underweight: Secondary | ICD-10-CM

## 2022-07-17 DIAGNOSIS — Z23 Encounter for immunization: Secondary | ICD-10-CM

## 2022-07-17 DIAGNOSIS — Z1159 Encounter for screening for other viral diseases: Secondary | ICD-10-CM

## 2022-07-17 MED ORDER — SERTRALINE HCL 50 MG PO TABS
50.0000 mg | ORAL_TABLET | Freq: Every day | ORAL | 1 refills | Status: DC
Start: 2022-07-17 — End: 2023-01-16

## 2022-07-17 NOTE — Progress Notes (Signed)
HPI  Patient presents to clinic today to establish care and for management of the conditions listed below.  Anxiety and Depression: She is not currently taking any medications for this.  She is not currently seeing a therapist.  She denies SI/HI.  Vagal Episodes: Occurs intermittently. This has happened 8 times throughout the years.  She has never had workup for this in the past.  Past Medical History:  Diagnosis Date   Anxiety    History of fainting spells of unknown cause     No current outpatient medications on file.   No current facility-administered medications for this visit.    Allergies  Allergen Reactions   Morphine And Related Swelling and Rash    Immediate vein hypersensitivity, swelling, redness and rash.     Family History  Problem Relation Age of Onset   Healthy Mother    Testicular cancer Father    Healthy Sister    Healthy Sister    Pancreatic cancer Maternal Grandmother    Depression Maternal Grandfather    Diabetes Maternal Grandfather     Social History   Socioeconomic History   Marital status: Single    Spouse name: Not on file   Number of children: 2   Years of education: Not on file   Highest education level: Not on file  Occupational History   Not on file  Tobacco Use   Smoking status: Former    Packs/day: 0.50    Types: Cigarettes   Smokeless tobacco: Never  Vaping Use   Vaping Use: Every day   Substances: Nicotine, Flavoring  Substance and Sexual Activity   Alcohol use: Yes    Comment: Social    Drug use: No   Sexual activity: Not Currently  Other Topics Concern   Not on file  Social History Narrative   Not on file   Social Determinants of Health   Financial Resource Strain: Not on file  Food Insecurity: Not on file  Transportation Needs: Not on file  Physical Activity: Not on file  Stress: Not on file  Social Connections: Not on file  Intimate Partner Violence: Not At Risk (07/18/2020)   Humiliation, Afraid, Rape, and  Kick questionnaire    Fear of Current or Ex-Partner: No    Emotionally Abused: No    Physically Abused: No    Sexually Abused: No    ROS:  Constitutional: Denies fever, malaise, fatigue, headache or abrupt weight changes.  HEENT: Denies eye pain, eye redness, ear pain, ringing in the ears, wax buildup, runny nose, nasal congestion, bloody nose, or sore throat. Respiratory: Denies difficulty breathing, shortness of breath, cough or sputum production.   Cardiovascular: Denies chest pain, chest tightness, palpitations or swelling in the hands or feet.  Gastrointestinal: Denies abdominal pain, bloating, constipation, diarrhea or blood in the stool.  GU: Denies frequency, urgency, pain with urination, blood in urine, odor or discharge. Musculoskeletal: Denies decrease in range of motion, difficulty with gait, muscle pain or joint pain and swelling.  Skin: Denies redness, rashes, lesions or ulcercations.  Neurological: Denies dizziness, difficulty with memory, difficulty with speech or problems with balance and coordination.  Psych: Patient has a history of anxiety and depression.  Denies SI/HI.  No other specific complaints in a complete review of systems (except as listed in HPI above).  PE:  BP 114/68 (BP Location: Right Arm, Patient Position: Sitting, Cuff Size: Normal)   Pulse 74   Temp 97.7 F (36.5 C) (Temporal)   Ht 5\' 7"  (  1.702 m)   Wt 118 lb (53.5 kg)   SpO2 99%   Breastfeeding No   BMI 18.48 kg/m  Wt Readings from Last 3 Encounters:  07/17/22 118 lb (53.5 kg)  12/21/21 120 lb (54.4 kg)  04/16/21 128 lb (58.1 kg)    General: Appears her stated age, underweight, in NAD. HEENT: Head: normal shape and size; Eyes: sclera white, no icterus, conjunctiva pink, PERRLA and EOMs intact;  Neck: Neck supple, trachea midline. No masses, lumps or thyromegaly present.  Cardiovascular: Normal rate and rhythm. S1,S2 noted.  No murmur, rubs or gallops noted. No JVD or BLE edema. No  carotid bruits noted. Pulmonary/Chest: Normal effort and positive vesicular breath sounds. No respiratory distress. No wheezes, rales or ronchi noted.  Musculoskeletal: No difficulty with gait.  Neurological: Alert and oriented. Coordination normal.  Psychiatric: Mood and affect normal.  Tearful. Judgment and thought content normal.     BMET    Component Value Date/Time   NA 140 12/26/2019 0948   NA 142 08/16/2015 1017   NA 139 12/31/2011 0458   K 4.0 12/26/2019 0948   K 3.8 12/31/2011 0458   CL 107 12/26/2019 0948   CL 108 (H) 12/31/2011 0458   CO2 26 12/26/2019 0948   CO2 20 (L) 12/31/2011 0458   GLUCOSE 108 (H) 12/26/2019 0948   GLUCOSE 61 (L) 12/31/2011 0458   BUN 15 12/26/2019 0948   BUN 13 08/16/2015 1017   BUN 8 12/31/2011 0458   CREATININE 0.70 12/26/2019 0948   CREATININE 0.41 (L) 12/31/2011 0458   CALCIUM 8.9 12/26/2019 0948   CALCIUM 8.1 (L) 12/31/2011 0458   GFRNONAA >60 12/26/2019 0948   GFRNONAA >60 12/31/2011 0458   GFRAA >60 12/26/2019 0948   GFRAA >60 12/31/2011 0458    Lipid Panel  No results found for: "CHOL", "TRIG", "HDL", "CHOLHDL", "VLDL", "LDLCALC"  CBC    Component Value Date/Time   WBC 6.9 04/22/2021 1251   RBC 4.21 04/22/2021 1251   HGB 11.8 (L) 04/22/2021 1251   HGB 10.5 (L) 12/12/2020 1201   HCT 37.4 04/22/2021 1251   HCT 31.7 (L) 12/12/2020 1201   PLT 181 04/22/2021 1251   PLT 189 12/12/2020 1201   MCV 88.8 04/22/2021 1251   MCV 91 12/12/2020 1201   MCV 88 05/25/2012 1736   MCH 28.0 04/22/2021 1251   MCHC 31.6 04/22/2021 1251   RDW 13.4 04/22/2021 1251   RDW 12.2 12/12/2020 1201   RDW 12.9 05/25/2012 1736   LYMPHSABS 2.3 12/12/2020 1201   LYMPHSABS 3.5 05/25/2012 1736   MONOABS 0.7 05/25/2012 1736   EOSABS 0.2 12/12/2020 1201   EOSABS 0.0 05/25/2012 1736   BASOSABS 0.1 12/12/2020 1201   BASOSABS 0.1 05/25/2012 1736    Hgb A1C No results found for: "HGBA1C"   Assessment and Plan:    Nicki Reaper, NP

## 2022-07-17 NOTE — Assessment & Plan Note (Signed)
Will check TSH

## 2022-07-17 NOTE — Assessment & Plan Note (Signed)
Will check CBC, c-Met and TSH today Encouraged adequate water intake Encouraged her to eat at routine intervals

## 2022-07-17 NOTE — Patient Instructions (Signed)

## 2022-07-17 NOTE — Assessment & Plan Note (Signed)
Will trial sertraline 50 mg daily Referral to psychology for therapy Support offered

## 2022-07-18 ENCOUNTER — Encounter: Payer: Self-pay | Admitting: Internal Medicine

## 2022-07-18 LAB — COMPLETE METABOLIC PANEL WITH GFR
AG Ratio: 2 (calc) (ref 1.0–2.5)
ALT: 8 U/L (ref 6–29)
AST: 15 U/L (ref 10–30)
Albumin: 4.4 g/dL (ref 3.6–5.1)
Alkaline phosphatase (APISO): 49 U/L (ref 31–125)
BUN: 15 mg/dL (ref 7–25)
CO2: 28 mmol/L (ref 20–32)
Calcium: 9.4 mg/dL (ref 8.6–10.2)
Chloride: 106 mmol/L (ref 98–110)
Creat: 0.69 mg/dL (ref 0.50–0.96)
Globulin: 2.2 g/dL (calc) (ref 1.9–3.7)
Glucose, Bld: 80 mg/dL (ref 65–99)
Potassium: 4.2 mmol/L (ref 3.5–5.3)
Sodium: 139 mmol/L (ref 135–146)
Total Bilirubin: 0.4 mg/dL (ref 0.2–1.2)
Total Protein: 6.6 g/dL (ref 6.1–8.1)
eGFR: 120 mL/min/{1.73_m2} (ref >=60)

## 2022-07-18 LAB — LIPID PANEL
Cholesterol: 134 mg/dL (ref <200)
HDL: 50 mg/dL (ref >=50)
LDL Cholesterol (Calc): 74 mg/dL (calc)
Non-HDL Cholesterol (Calc): 84 mg/dL (calc) (ref <130)
Total CHOL/HDL Ratio: 2.7 (calc) (ref <5.0)
Triglycerides: 34 mg/dL (ref <150)

## 2022-07-18 LAB — CBC
HCT: 33.1 % — ABNORMAL LOW (ref 35.0–45.0)
Hemoglobin: 10.9 g/dL — ABNORMAL LOW (ref 11.7–15.5)
MCH: 29 pg (ref 27.0–33.0)
MCHC: 32.9 g/dL (ref 32.0–36.0)
MCV: 88 fL (ref 80.0–100.0)
MPV: 13.4 fL — ABNORMAL HIGH (ref 7.5–12.5)
Platelets: 166 10*3/uL (ref 140–400)
RBC: 3.76 10*6/uL — ABNORMAL LOW (ref 3.80–5.10)
RDW: 12.4 % (ref 11.0–15.0)
WBC: 6.8 10*3/uL (ref 3.8–10.8)

## 2022-07-18 LAB — HEPATITIS C ANTIBODY: Hepatitis C Ab: NONREACTIVE

## 2022-07-18 LAB — TSH: TSH: 1.67 mIU/L

## 2022-08-10 ENCOUNTER — Emergency Department
Admission: EM | Admit: 2022-08-10 | Discharge: 2022-08-10 | Disposition: A | Discharge status: Home / Self Care | Payer: Medicaid Other | Attending: Emergency Medicine | Admitting: Emergency Medicine

## 2022-08-10 ENCOUNTER — Other Ambulatory Visit: Payer: Self-pay

## 2022-08-10 ENCOUNTER — Encounter: Payer: Self-pay | Admitting: Emergency Medicine

## 2022-08-10 DIAGNOSIS — Z1152 Encounter for screening for COVID-19: Secondary | ICD-10-CM | POA: Diagnosis not present

## 2022-08-10 DIAGNOSIS — R1013 Epigastric pain: Secondary | ICD-10-CM | POA: Diagnosis not present

## 2022-08-10 DIAGNOSIS — R109 Unspecified abdominal pain: Secondary | ICD-10-CM | POA: Diagnosis present

## 2022-08-10 LAB — RESP PANEL BY RT-PCR (RSV, FLU A&B, COVID)  RVPGX2
Influenza A by PCR: NEGATIVE
Influenza B by PCR: NEGATIVE
Resp Syncytial Virus by PCR: NEGATIVE
SARS Coronavirus 2 by RT PCR: NEGATIVE

## 2022-08-10 LAB — URINE DRUG SCREEN, QUALITATIVE (ARMC ONLY)
Amphetamines, Ur Screen: NOT DETECTED
Barbiturates, Ur Screen: NOT DETECTED
Benzodiazepine, Ur Scrn: NOT DETECTED
Cannabinoid 50 Ng, Ur ~~LOC~~: POSITIVE — AB
Cocaine Metabolite,Ur ~~LOC~~: NOT DETECTED
MDMA (Ecstasy)Ur Screen: NOT DETECTED
Methadone Scn, Ur: NOT DETECTED
Opiate, Ur Screen: NOT DETECTED
Phencyclidine (PCP) Ur S: NOT DETECTED
Tricyclic, Ur Screen: NOT DETECTED

## 2022-08-10 LAB — CBC
HCT: 34.8 % — ABNORMAL LOW (ref 36.0–46.0)
Hemoglobin: 11.1 g/dL — ABNORMAL LOW (ref 12.0–15.0)
MCH: 28.6 pg (ref 26.0–34.0)
MCHC: 31.9 g/dL (ref 30.0–36.0)
MCV: 89.7 fL (ref 80.0–100.0)
Platelets: 233 10*3/uL (ref 150–400)
RBC: 3.88 MIL/uL (ref 3.87–5.11)
RDW: 13.1 % (ref 11.5–15.5)
WBC: 9 10*3/uL (ref 4.0–10.5)
nRBC: 0 % (ref 0.0–0.2)

## 2022-08-10 LAB — URINALYSIS, ROUTINE W REFLEX MICROSCOPIC
Bilirubin Urine: NEGATIVE
Glucose, UA: NEGATIVE mg/dL
Hgb urine dipstick: NEGATIVE
Ketones, ur: NEGATIVE mg/dL
Nitrite: NEGATIVE
Protein, ur: NEGATIVE mg/dL
Specific Gravity, Urine: 1.026 (ref 1.005–1.030)
pH: 5 (ref 5.0–8.0)

## 2022-08-10 LAB — COMPREHENSIVE METABOLIC PANEL
ALT: 64 U/L — ABNORMAL HIGH (ref 0–44)
AST: 48 U/L — ABNORMAL HIGH (ref 15–41)
Albumin: 3.9 g/dL (ref 3.5–5.0)
Alkaline Phosphatase: 54 U/L (ref 38–126)
Anion gap: 5 (ref 5–15)
BUN: 12 mg/dL (ref 6–20)
CO2: 25 mmol/L (ref 22–32)
Calcium: 8.7 mg/dL — ABNORMAL LOW (ref 8.9–10.3)
Chloride: 110 mmol/L (ref 98–111)
Creatinine, Ser: 0.62 mg/dL (ref 0.44–1.00)
GFR, Estimated: >60 mL/min (ref >60)
Glucose, Bld: 98 mg/dL (ref 70–99)
Potassium: 3.8 mmol/L (ref 3.5–5.1)
Sodium: 140 mmol/L (ref 135–145)
Total Bilirubin: 0.6 mg/dL (ref 0.3–1.2)
Total Protein: 6.8 g/dL (ref 6.5–8.1)

## 2022-08-10 LAB — LIPASE, BLOOD: Lipase: 29 U/L (ref 11–51)

## 2022-08-10 LAB — POC URINE PREG, ED: Preg Test, Ur: NEGATIVE

## 2022-08-10 LAB — MAGNESIUM: Magnesium: 2.1 mg/dL (ref 1.7–2.4)

## 2022-08-10 MED ORDER — SODIUM CHLORIDE 0.9 % IV BOLUS
1000.0000 mL | Freq: Once | INTRAVENOUS | Status: AC
  Administered 2022-08-10: 1000 mL via INTRAVENOUS

## 2022-08-10 MED ORDER — ONDANSETRON 4 MG PO TBDP: 4.0000 mg | ORAL_TABLET | Freq: Three times a day (TID) | ORAL | 0 refills | Status: DC | PRN

## 2022-08-10 MED ORDER — DROPERIDOL 2.5 MG/ML IJ SOLN
1.2500 mg | Freq: Once | INTRAMUSCULAR | Status: AC
  Administered 2022-08-10: 1.25 mg via INTRAVENOUS
  Filled 2022-08-10: qty 2

## 2022-08-10 MED ORDER — OMEPRAZOLE MAGNESIUM 20 MG PO TBEC: 20.0000 mg | DELAYED_RELEASE_TABLET | Freq: Every day | ORAL | 0 refills | Status: DC

## 2022-08-10 MED ORDER — FAMOTIDINE IN NACL 20-0.9 MG/50ML-% IV SOLN
20.0000 mg | Freq: Once | INTRAVENOUS | Status: AC
  Administered 2022-08-10: 20 mg via INTRAVENOUS
  Filled 2022-08-10: qty 50

## 2022-08-10 MED ORDER — KETOROLAC TROMETHAMINE 15 MG/ML IJ SOLN
15.0000 mg | Freq: Once | INTRAMUSCULAR | Status: AC
  Administered 2022-08-10: 15 mg via INTRAVENOUS
  Filled 2022-08-10: qty 1

## 2022-08-10 MED ORDER — ONDANSETRON HCL 4 MG/2ML IJ SOLN
4.0000 mg | Freq: Once | INTRAMUSCULAR | Status: AC
  Administered 2022-08-10: 4 mg via INTRAVENOUS
  Filled 2022-08-10: qty 2

## 2022-08-10 NOTE — ED Triage Notes (Signed)
Pt via POV from home. Pt c/o mid-abd pain for the past 2 days with nausea. Denies urinary symptoms. Pt has hx of appendectomy. Pt is A&Ox4 and NAD

## 2022-08-10 NOTE — ED Notes (Signed)
Pt to ED for mid abdominal pain since 2 days. See triage note. Pt hunched over in wheelchair. EDP at bedside.

## 2022-08-10 NOTE — ED Provider Notes (Signed)
[facility] Provider Note    Event Date/Time   First MD Initiated Contact with Patient 08/10/22 1126     (approximate)   History   Abdominal Pain   HPI  Tracie Meyer is a 31 y.o. female past medical history significant for chronic abdominal pain, who presents to the emergency department with abdominal pain associated with nausea and vomiting.  2 days of upper abdominal pain and states that she is unable to keep anything down.  Also endorses constipation is only having small hard stools.  Denies any fever or chills.  Prior appendectomy and tubal ligation.  Does endorse chronic abdominal issues.  Denies any dysuria, urinary urgency or frequency.  Denies any alcohol use or marijuana use.  States that she is not currently sexually active.  Denies any concern for an STI.     Physical Exam   Triage Vital Signs: ED Triage Vitals  Enc Vitals Group     BP 08/10/22 1054 106/60     Pulse Rate 08/10/22 1054 (!) 50     Resp 08/10/22 1054 18     Temp 08/10/22 1054 98.1 F (36.7 C)     Temp Source 08/10/22 1054 Oral     SpO2 08/10/22 1054 99 %     Weight 08/10/22 1052 115 lb (52.2 kg)     Height 08/10/22 1052 5\' 7"  (1.702 m)     Head Circumference --      Peak Flow --      Pain Score 08/10/22 1052 8     Pain Loc --      Pain Edu? --      Excl. in GC? --     Most recent vital signs: Vitals:   08/10/22 1330 08/10/22 1351  BP: 106/62 (!) 94/55  Pulse: (!) 56 (!) 46  Resp: 16 18  Temp: 98 F (36.7 C) 98 F (36.7 C)  SpO2: 100% 100%    Physical Exam Constitutional:      Appearance: She is well-developed.  HENT:     Head: Atraumatic.  Eyes:     Conjunctiva/sclera: Conjunctivae normal.  Cardiovascular:     Rate and Rhythm: Regular rhythm.  Pulmonary:     Effort: No respiratory distress.  Abdominal:     General: There is no distension.     Tenderness: There is abdominal tenderness in the epigastric area.  Musculoskeletal:        General: Normal  range of motion.     Cervical back: Normal range of motion.  Skin:    General: Skin is warm.  Neurological:     Mental Status: She is alert. Mental status is at baseline.      IMPRESSION / MDM / ASSESSMENT AND PLAN / ED COURSE  I reviewed the triage vital signs and the nursing notes.  Differential diagnosis including gastritis/PUD, pancreatitis, cyclical vomiting syndrome, chronic abdominal pain, symptomatic cholelithiasis, small bowel obstruction, constipation    Labs (all labs ordered are listed, but only abnormal results are displayed) Labs interpreted as -   Anemia but hemoglobin is stable and at her baseline.  Pregnancy test is negative.  UA with findings concerning for possible urinary tract infection however patient is asymptomatic and do not believe that these are causing her abdominal pain or vomiting.  Labs Reviewed  COMPREHENSIVE METABOLIC PANEL - Abnormal; Notable for the following components:      Result Value   Calcium 8.7 (*)    AST 48 (*)  ALT 64 (*)    All other components within normal limits  CBC - Abnormal; Notable for the following components:   Hemoglobin 11.1 (*)    HCT 34.8 (*)    All other components within normal limits  URINALYSIS, ROUTINE W REFLEX MICROSCOPIC - Abnormal; Notable for the following components:   Color, Urine YELLOW (*)    APPearance HAZY (*)    Leukocytes,Ua MODERATE (*)    Bacteria, UA RARE (*)    All other components within normal limits  URINE DRUG SCREEN, QUALITATIVE ([facility] ONLY) - Abnormal; Notable for the following components:   Cannabinoid 50 Ng, Ur [place] POSITIVE (*)    All other components within normal limits  RESP PANEL BY RT-PCR (RSV, FLU A&B, COVID)  RVPGX2  LIPASE, BLOOD  MAGNESIUM  POC URINE PREG, ED    Patient was treated with IV fluids, IV Pepcid, Zofran.    Bedside gallbladder ultrasound without gallstones or signs of cholecystitis.  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  Repeat abdominal exam is  nontender to palpation, low suspicion for small bowel obstruction.  Reevaluation return of some mild nausea and pain.  Given droperidol and on reevaluation had resolution of her symptoms.  Discussed close follow-up with primary care provider and possible follow-up with gastroenterology if she had recurrent symptoms.  Discussed at length marijuana cessation.  PROCEDURES:  Critical Care performed: No  Procedures  Patient's presentation is most consistent with acute presentation with potential threat to life or bodily function.   MEDICATIONS ORDERED IN ED: Medications  sodium chloride 0.9 % bolus 1,000 mL (0 mLs Intravenous Stopped 08/10/22 1329)  ondansetron (ZOFRAN) injection 4 mg (4 mg Intravenous Given 08/10/22 1146)  ketorolac (TORADOL) 15 MG/ML injection 15 mg (15 mg Intravenous Given 08/10/22 1144)  famotidine (PEPCID) IVPB 20 mg premix (0 mg Intravenous Stopped 08/10/22 1233)  droperidol (INAPSINE) 2.5 MG/ML injection 1.25 mg (1.25 mg Intravenous Given 08/10/22 1327)    FINAL CLINICAL IMPRESSION(S) / ED DIAGNOSES   Final diagnoses:  Epigastric pain     Rx / DC Orders   ED Discharge Orders          Ordered    omeprazole (PRILOSEC OTC) 20 MG tablet  Daily        08/10/22 1349    ondansetron (ZOFRAN-ODT) 4 MG disintegrating tablet  Every 8 hours PRN        08/10/22 1349             Note:  This document was prepared using Dragon voice recognition software and may include unintentional dictation errors.   Corena Herter, MD 08/10/22 [phone]

## 2022-08-10 NOTE — Discharge Instructions (Addendum)
You were seen in the emergency department for upper abdominal pain.  You had an ultrasound of your gallbladder that did not show any signs of gallstones or signs of a gallbladder infection.  If your symptoms continue talk with your primary care physician, you may need a HIDA scan.  Start a low fatty diet.  You are given a prescription for acid reflux medication called omeprazole it is importantly take this every day.  Call your primary care physician on Tuesday to schedule a close follow-up appointment.  Return to the emergency department for any return or worsening symptoms.  Stay hydrated and drink plenty of fluids.

## 2022-10-07 ENCOUNTER — Ambulatory Visit: Payer: Medicaid Other | Admitting: Internal Medicine

## 2022-10-07 NOTE — Progress Notes (Deleted)
Subjective:    Patient ID: Tracie Meyer, female    DOB: [birth year], 32 y.o.   MRN: [id number]  HPI  Patient presents to clinic today with complaint of.  Review of Systems     Past Medical History:  Diagnosis Date   Anxiety    History of fainting spells of unknown cause     Current Outpatient Medications  Medication Sig Dispense Refill   omeprazole (PRILOSEC OTC) 20 MG tablet Take 1 tablet (20 mg total) by mouth daily. 90 tablet 0   ondansetron (ZOFRAN-ODT) 4 MG disintegrating tablet Take 1 tablet (4 mg total) by mouth every 8 (eight) hours as needed for nausea or vomiting. 30 tablet 0   sertraline (ZOLOFT) 50 MG tablet Take 1 tablet (50 mg total) by mouth daily. 90 tablet 1   No current facility-administered medications for this visit.    Allergies  Allergen Reactions   Morphine And Related Swelling and Rash    Immediate vein hypersensitivity, swelling, redness and rash.     Family History  Problem Relation Age of Onset   Healthy Mother    Testicular cancer Father    Healthy Sister    Healthy Sister    Pancreatic cancer Maternal Grandmother    Depression Maternal Grandfather    Diabetes Maternal Grandfather     Social History   Socioeconomic History   Marital status: Single    Spouse name: Not on file   Number of children: 2   Years of education: Not on file   Highest education level: Not on file  Occupational History   Not on file  Tobacco Use   Smoking status: Former    Packs/day: 0.50    Types: Cigarettes   Smokeless tobacco: Never  Vaping Use   Vaping Use: Every day   Substances: Nicotine, Flavoring  Substance and Sexual Activity   Alcohol use: Yes    Comment: Social    Drug use: No   Sexual activity: Not Currently  Other Topics Concern   Not on file  Social History Narrative   Not on file   Social Determinants of Health   Financial Resource Strain: Not on file  Food Insecurity: Not on file  Transportation Needs: Not on file  Physical  Activity: Not on file  Stress: Not on file  Social Connections: Not on file  Intimate Partner Violence: Not At Risk (07/18/2020)   Humiliation, Afraid, Rape, and Kick questionnaire    Fear of Current or Ex-Partner: No    Emotionally Abused: No    Physically Abused: No    Sexually Abused: No     Constitutional: Denies fever, malaise, fatigue, headache or abrupt weight changes.  HEENT: Denies eye pain, eye redness, ear pain, ringing in the ears, wax buildup, runny nose, nasal congestion, bloody nose, or sore throat. Respiratory: Denies difficulty breathing, shortness of breath, cough or sputum production.   Cardiovascular: Denies chest pain, chest tightness, palpitations or swelling in the hands or feet.  Gastrointestinal: Denies abdominal pain, bloating, constipation, diarrhea or blood in the stool.  GU: Denies urgency, frequency, pain with urination, burning sensation, blood in urine, odor or discharge. Musculoskeletal: Denies decrease in range of motion, difficulty with gait, muscle pain or joint pain and swelling.  Skin: Denies redness, rashes, lesions or ulcercations.  Neurological: Denies dizziness, difficulty with memory, difficulty with speech or problems with balance and coordination.  Psych: Denies anxiety, depression, SI/HI.  No other specific complaints in a complete review of systems (except  as listed in HPI above).  Objective:   Physical Exam   There were no vitals taken for this visit. Wt Readings from Last 3 Encounters:  08/10/22 115 lb (52.2 kg)  07/17/22 118 lb (53.5 kg)  12/21/21 120 lb (54.4 kg)    General: Appears their stated age, well developed, well nourished in NAD. Skin: Warm, dry and intact. No rashes, lesions or ulcerations noted. HEENT: Head: normal shape and size; Eyes: sclera white, no icterus, conjunctiva pink, PERRLA and EOMs intact; Ears: Tm's gray and intact, normal light reflex; Nose: mucosa pink and moist, septum midline; Throat/Mouth: Teeth  present, mucosa pink and moist, no exudate, lesions or ulcerations noted.  Neck:  Neck supple, trachea midline. No masses, lumps or thyromegaly present.  Cardiovascular: Normal rate and rhythm. S1,S2 noted.  No murmur, rubs or gallops noted. No JVD or BLE edema. No carotid bruits noted. Pulmonary/Chest: Normal effort and positive vesicular breath sounds. No respiratory distress. No wheezes, rales or ronchi noted.  Abdomen: Soft and nontender. Normal bowel sounds. No distention or masses noted. Liver, spleen and kidneys non palpable. Musculoskeletal: Normal range of motion. No signs of joint swelling. No difficulty with gait.  Neurological: Alert and oriented. Cranial nerves II-XII grossly intact. Coordination normal.  Psychiatric: Mood and affect normal. Behavior is normal. Judgment and thought content normal.     BMET    Component Value Date/Time   NA 140 08/10/2022 1056   NA 142 08/16/2015 1017   NA 139 12/31/2011 0458   K 3.8 08/10/2022 1056   K 3.8 12/31/2011 0458   CL 110 08/10/2022 1056   CL 108 (H) 12/31/2011 0458   CO2 25 08/10/2022 1056   CO2 20 (L) 12/31/2011 0458   GLUCOSE 98 08/10/2022 1056   GLUCOSE 61 (L) 12/31/2011 0458   BUN 12 08/10/2022 1056   BUN 13 08/16/2015 1017   BUN 8 12/31/2011 0458   CREATININE 0.62 08/10/2022 1056   CREATININE 0.69 07/17/2022 1058   CALCIUM 8.7 (L) 08/10/2022 1056   CALCIUM 8.1 (L) 12/31/2011 0458   GFRNONAA >60 08/10/2022 1056   GFRNONAA >60 12/31/2011 0458   GFRAA >60 12/26/2019 0948   GFRAA >60 12/31/2011 0458    Lipid Panel     Component Value Date/Time   CHOL 134 07/17/2022 1058   TRIG 34 07/17/2022 1058   HDL 50 07/17/2022 1058   CHOLHDL 2.7 07/17/2022 1058   LDLCALC 74 07/17/2022 1058    CBC    Component Value Date/Time   WBC 9.0 08/10/2022 1056   RBC 3.88 08/10/2022 1056   HGB 11.1 (L) 08/10/2022 1056   HGB 10.5 (L) 12/12/2020 1201   HCT 34.8 (L) 08/10/2022 1056   HCT 31.7 (L) 12/12/2020 1201   PLT 233  08/10/2022 1056   PLT 189 12/12/2020 1201   MCV 89.7 08/10/2022 1056   MCV 91 12/12/2020 1201   MCV 88 05/25/2012 1736   MCH 28.6 08/10/2022 1056   MCHC 31.9 08/10/2022 1056   RDW 13.1 08/10/2022 1056   RDW 12.2 12/12/2020 1201   RDW 12.9 05/25/2012 1736   LYMPHSABS 2.3 12/12/2020 1201   LYMPHSABS 3.5 05/25/2012 1736   MONOABS 0.7 05/25/2012 1736   EOSABS 0.2 12/12/2020 1201   EOSABS 0.0 05/25/2012 1736   BASOSABS 0.1 12/12/2020 1201   BASOSABS 0.1 05/25/2012 1736    Hgb A1C No results found for: "HGBA1C"         Assessment & Plan:     RTC in 4  months for annual exam Webb Silversmith, NP

## 2022-11-12 IMAGING — CR DG CHEST 2V
1 series · 2 of 2 positions shown · non-contrast
Comparison: 06/21/2011

CLINICAL DATA: Productive cough, fever

EXAM:
CHEST - 2 VIEW

[Series 1: dg chest 2 view · 0.14mm/px · 2 of 2 slices shown]
[im 1/2]
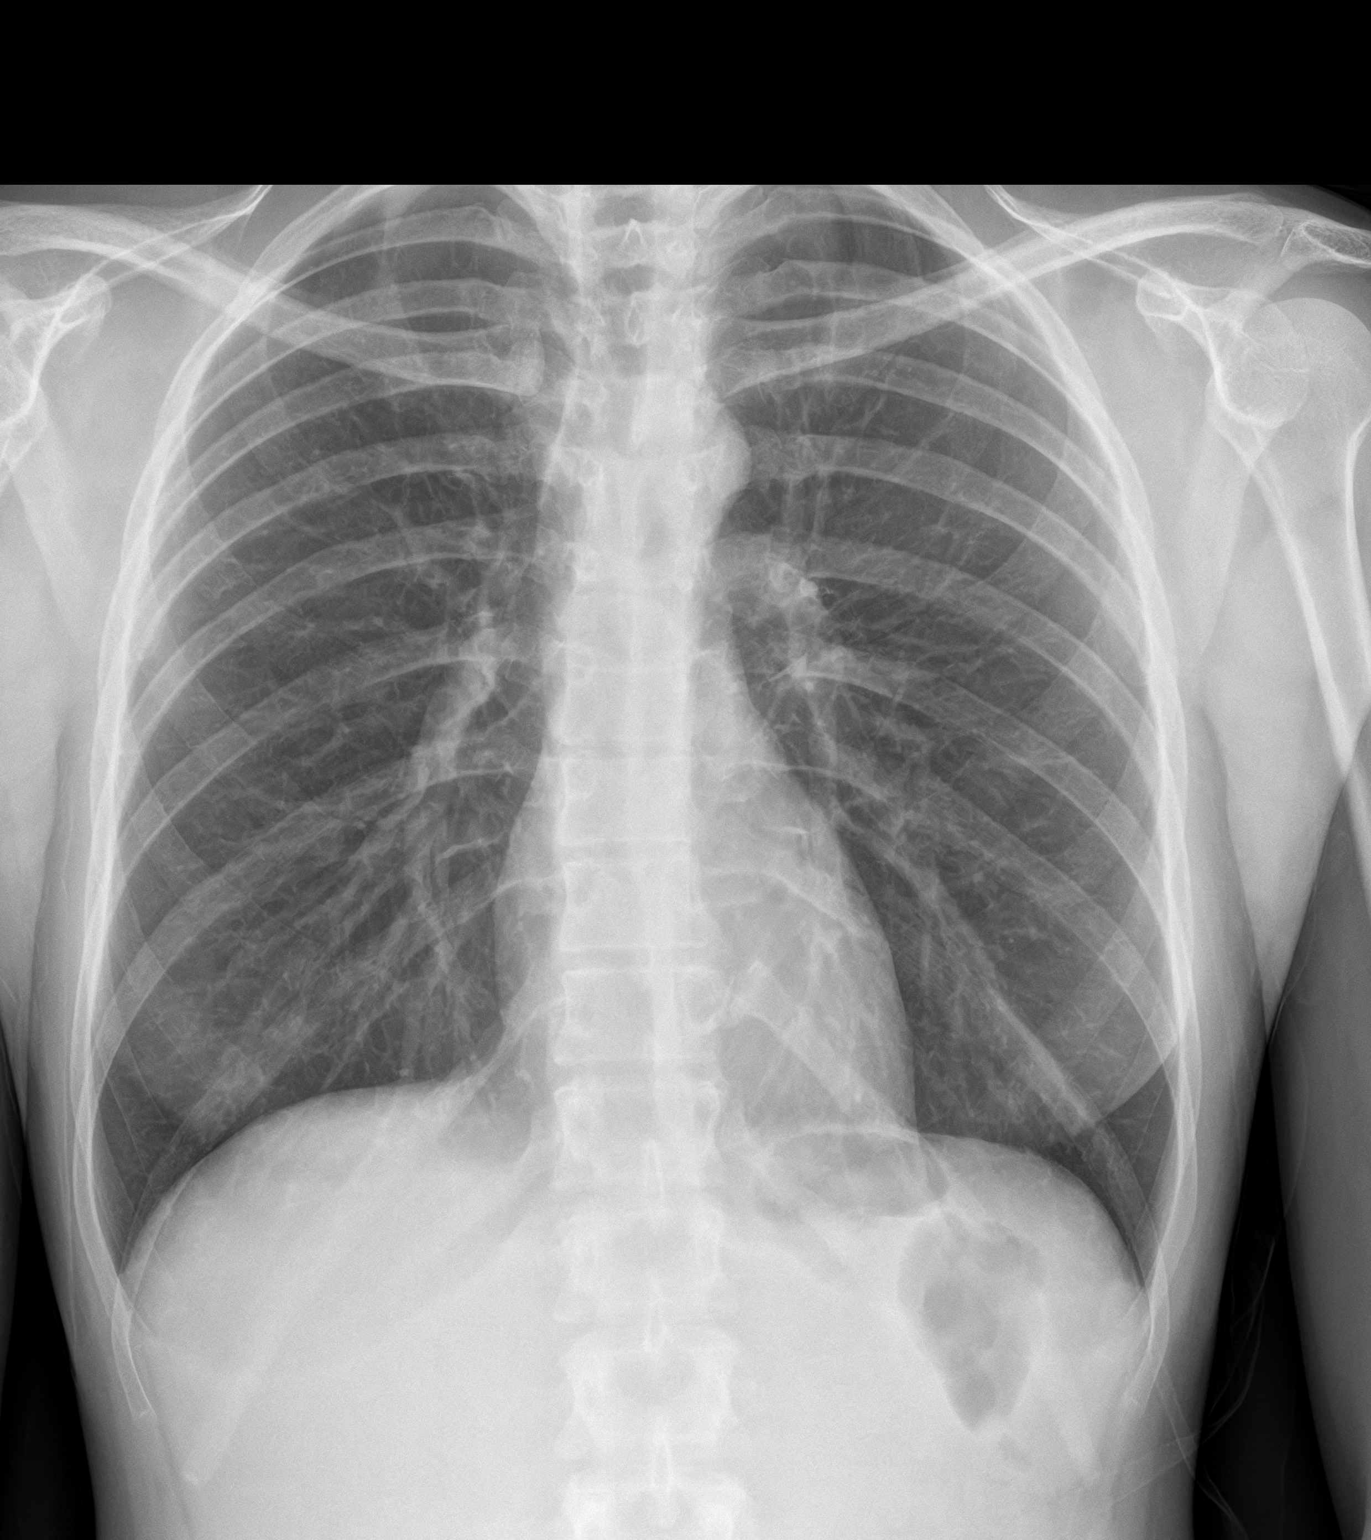
[im 2/2]
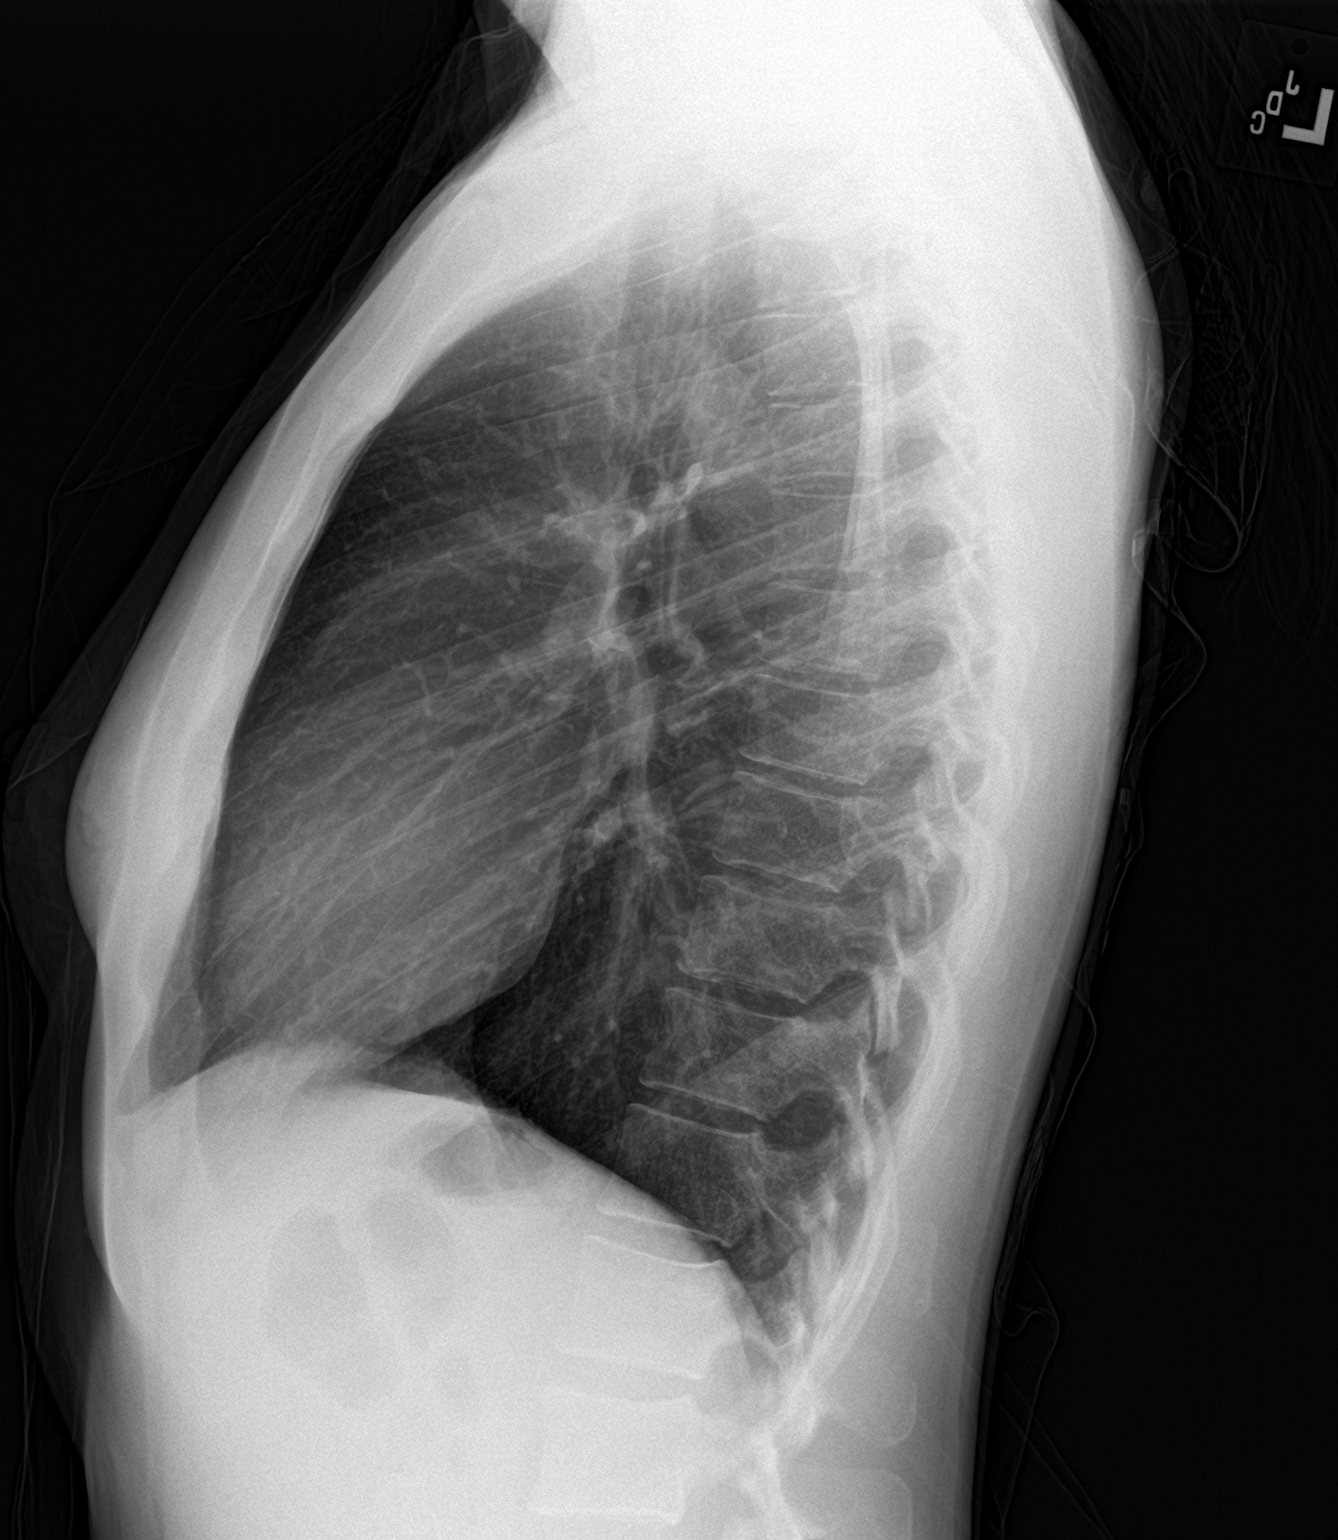

[2 of 2 positions shown; findings below may reference images not displayed]

FINDINGS: The heart size and mediastinal contours are within normal limits.
Both lungs are clear. The visualized skeletal structures are
unremarkable.
IMPRESSION: No active cardiopulmonary disease.

## 2023-01-12 ENCOUNTER — Encounter: Payer: Self-pay | Admitting: Internal Medicine

## 2023-01-16 ENCOUNTER — Encounter: Payer: Self-pay | Admitting: Internal Medicine

## 2023-01-16 ENCOUNTER — Ambulatory Visit (INDEPENDENT_AMBULATORY_CARE_PROVIDER_SITE_OTHER): Payer: Medicaid Other | Admitting: Internal Medicine

## 2023-01-16 VITALS — BP 100/64 | HR 76 | Temp 96.2°F | Ht 67.0 in | Wt 114.0 lb

## 2023-01-16 DIAGNOSIS — R55 Syncope and collapse: Secondary | ICD-10-CM

## 2023-01-16 DIAGNOSIS — M542 Cervicalgia: Secondary | ICD-10-CM | POA: Diagnosis not present

## 2023-01-16 DIAGNOSIS — G8929 Other chronic pain: Secondary | ICD-10-CM

## 2023-01-16 DIAGNOSIS — B351 Tinea unguium: Secondary | ICD-10-CM | POA: Diagnosis not present

## 2023-01-16 DIAGNOSIS — R636 Underweight: Secondary | ICD-10-CM | POA: Diagnosis not present

## 2023-01-16 DIAGNOSIS — Z0001 Encounter for general adult medical examination with abnormal findings: Secondary | ICD-10-CM | POA: Diagnosis not present

## 2023-01-16 DIAGNOSIS — M546 Pain in thoracic spine: Secondary | ICD-10-CM | POA: Diagnosis not present

## 2023-01-16 DIAGNOSIS — Z3009 Encounter for other general counseling and advice on contraception: Secondary | ICD-10-CM

## 2023-01-16 DIAGNOSIS — F32A Depression, unspecified: Secondary | ICD-10-CM

## 2023-01-16 DIAGNOSIS — F419 Anxiety disorder, unspecified: Secondary | ICD-10-CM

## 2023-01-16 MED ORDER — MEDROXYPROGESTERONE ACETATE 150 MG/ML IM SUSP: 150.0000 mg | INTRAMUSCULAR | 1 refills | Status: DC

## 2023-01-16 MED ORDER — SERTRALINE HCL 100 MG PO TABS: 100.0000 mg | ORAL_TABLET | Freq: Every day | ORAL | 1 refills | Status: DC

## 2023-01-16 NOTE — Patient Instructions (Signed)

## 2023-01-16 NOTE — Assessment & Plan Note (Signed)
Increase sertraline to 100 mg daily Support offered

## 2023-01-16 NOTE — Progress Notes (Signed)
Subjective:    Patient ID: Tracie Meyer, female    DOB: 10-30-1990, 32 y.o.   MRN: [id number]  HPI  Patient presents to clinic today for her annual exam. She also reports multiple complaints.  She reports she has had another syncopal episode.  These have been occurring intermittently for years.  They are preceded by intense abdominal pain, nausea and vomiting.  She is able to tell when these "attacks" are going to occur.  She is not sure what triggers them.  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  She is taking her Omeprazole as prescribed.  She reports a history of chronic neck and back pain.  She describes the pain as sore and achy.  The pain can be worse with certain movements.  She denies numbness, tingling or weakness of her upper extremities.  She is requesting a MRI of her brain and imaging of her back as she has never had any workup for the syncopal episodes.  She would also like to get started back on Depo-Provera.  She is currently on her menses.  She also reports discoloration and cracking of her left great toenail.  She noticed this a few months ago.  She denies any injury to the area.  She has not tried anything OTC for this.  She also wants to follow-up on anxiety and depression.  She is still taking Sertraline as prescribed but does not feel like it is effective.  She is not currently seeing a therapist.  She denies SI/HI.  Flu: 07/2022 Tetanus: 01/2021 COVID: Never Pap smear: 09/2020 Dentist: as needed  Diet: She does eat meat. She consumes fruits and veggies. She does eat fried foods. She drinks mostly water, coffee and tea. Exercise: None  Review of Systems     Past Medical History:  Diagnosis Date   Anxiety    History of fainting spells of unknown cause     Current Outpatient Medications  Medication Sig Dispense Refill   omeprazole (PRILOSEC OTC) 20 MG tablet Take 1 tablet (20 mg total) by mouth daily. 90  tablet 0   ondansetron (ZOFRAN-ODT) 4 MG disintegrating tablet Take 1 tablet (4 mg total) by mouth every 8 (eight) hours as needed for nausea or vomiting. 30 tablet 0   sertraline (ZOLOFT) 50 MG tablet Take 1 tablet (50 mg total) by mouth daily. 90 tablet 1   No current facility-administered medications for this visit.    Allergies  Allergen Reactions   Morphine And Codeine Swelling and Rash    Immediate vein hypersensitivity, swelling, redness and rash.     Family History  Problem Relation Age of Onset   Healthy Mother    Testicular cancer Father    Healthy Sister    Healthy Sister    Pancreatic cancer Maternal Grandmother    Depression Maternal Grandfather    Diabetes Maternal Grandfather     Social History   Socioeconomic History   Marital status: Single    Spouse name: Not on file   Number of children: 2   Years of education: Not on file   Highest education level: Not on file  Occupational History   Not on file  Tobacco Use   Smoking status: Former    Packs/day: .5    Types: Cigarettes   Smokeless tobacco: Never  Vaping Use   Vaping Use: Every day   Substances: Nicotine, Flavoring  Substance and Sexual Activity  Alcohol use: Yes    Comment: Social    Drug use: No   Sexual activity: Not Currently  Other Topics Concern   Not on file  Social History Narrative   Not on file   Social Determinants of Health   Financial Resource Strain: Not on file  Food Insecurity: Not on file  Transportation Needs: Not on file  Physical Activity: Not on file  Stress: Not on file  Social Connections: Not on file  Intimate Partner Violence: Not At Risk (07/18/2020)   Humiliation, Afraid, Rape, and Kick questionnaire    Fear of Current or Ex-Partner: No    Emotionally Abused: No    Physically Abused: No    Sexually Abused: No     Constitutional: Pt reports frequent headaches. Denies fever, malaise, fatigue, headache or abrupt weight changes.  HEENT: Denies eye pain,  eye redness, ear pain, ringing in the ears, wax buildup, runny nose, nasal congestion, bloody nose, or sore throat. Respiratory: Denies difficulty breathing, shortness of breath, cough or sputum production.   Cardiovascular: Denies chest pain, chest tightness, palpitations or swelling in the hands or feet.  Gastrointestinal: Pt reports intermittent abdomina pain, nausea, vomiting. Denies abdominal pain, bloating, constipation, diarrhea or blood in the stool.  GU: Denies urgency, frequency, pain with urination, burning sensation, blood in urine, odor or discharge. Musculoskeletal: Pt reports chronic neck and back pain. Denies decrease in range of motion, difficulty with gait, muscle pain or joint pain and swelling.  Skin: Patient reports discoloration and cracking of the left great toenail.  Denies redness, rashes, lesions or ulcercations.  Neurological: Pt reports syncopal episode. Denies dizziness, difficulty with memory, difficulty with speech or problems with balance and coordination.  Psych: Patient has a history of anxiety and depression.  Denies SI/HI.  No other specific complaints in a complete review of systems (except as listed in HPI above).  Objective:   Physical Exam  BP 100/64 (BP Location: Right Arm, Patient Position: Sitting, Cuff Size: Normal)   Pulse 76   Temp (!) 96.2 F (35.7 C) (Temporal)   Ht 5\' 7"  (1.702 m)   Wt 114 lb (51.7 kg)   SpO2 97%   BMI 17.85 kg/m   Wt Readings from Last 3 Encounters:  08/10/22 115 lb (52.2 kg)  07/17/22 118 lb (53.5 kg)  12/21/21 120 lb (54.4 kg)    General: Appears her stated age, underweight, in NAD. Skin: Warm, dry and intact.  She has a vertical thickening/discoloration of the left great toenail resembling fungus. HEENT: Head: normal shape and size; Eyes: sclera white, no icterus, conjunctiva pink, PERRLA and EOMs intact;  Neck:  Neck supple, trachea midline. No masses, lumps or thyromegaly present.  Cardiovascular: Normal rate  and rhythm. S1,S2 noted.  No murmur, rubs or gallops noted. No JVD or BLE edema.  Pulmonary/Chest: Normal effort and positive vesicular breath sounds. No respiratory distress. No wheezes, rales or ronchi noted.  Abdomen: Soft and nontender. Normal bowel sounds. No distention or masses noted.  Musculoskeletal: Normal flexion, extension, rotation and lateral bending of the cervical spine.  No bony tenderness noted over the cervical spine.  She has pain generally throughout the thoracic spine.  Strength 5/5 BUE/BLE. No difficulty with gait.  Neurological: Alert and oriented. Cranial nerves II-XII grossly intact. Coordination normal.  Psychiatric: Mood and affect normal. Behavior is normal. Judgment and thought content normal.     BMET    Component Value Date/Time   NA 140 08/10/2022 1056   NA  142 08/16/2015 1017   NA 139 12/31/2011 0458   K 3.8 08/10/2022 1056   K 3.8 12/31/2011 0458   CL 110 08/10/2022 1056   CL 108 (H) 12/31/2011 0458   CO2 25 08/10/2022 1056   CO2 20 (L) 12/31/2011 0458   GLUCOSE 98 08/10/2022 1056   GLUCOSE 61 (L) 12/31/2011 0458   BUN 12 08/10/2022 1056   BUN 13 08/16/2015 1017   BUN 8 12/31/2011 0458   CREATININE 0.62 08/10/2022 1056   CREATININE 0.69 07/17/2022 1058   CALCIUM 8.7 (L) 08/10/2022 1056   CALCIUM 8.1 (L) 12/31/2011 0458   GFRNONAA >60 08/10/2022 1056   GFRNONAA >60 12/31/2011 0458   GFRAA >60 12/26/2019 0948   GFRAA >60 12/31/2011 0458    Lipid Panel     Component Value Date/Time   CHOL 134 07/17/2022 1058   TRIG 34 07/17/2022 1058   HDL 50 07/17/2022 1058   CHOLHDL 2.7 07/17/2022 1058   LDLCALC 74 07/17/2022 1058    CBC    Component Value Date/Time   WBC 9.0 08/10/2022 1056   RBC 3.88 08/10/2022 1056   HGB 11.1 (L) 08/10/2022 1056   HGB 10.5 (L) 12/12/2020 1201   HCT 34.8 (L) 08/10/2022 1056   HCT 31.7 (L) 12/12/2020 1201   PLT 233 08/10/2022 1056   PLT 189 12/12/2020 1201   MCV 89.7 08/10/2022 1056   MCV 91 12/12/2020 1201    MCV 88 05/25/2012 1736   MCH 28.6 08/10/2022 1056   MCHC 31.9 08/10/2022 1056   RDW 13.1 08/10/2022 1056   RDW 12.2 12/12/2020 1201   RDW 12.9 05/25/2012 1736   LYMPHSABS 2.3 12/12/2020 1201   LYMPHSABS 3.5 05/25/2012 1736   MONOABS 0.7 05/25/2012 1736   EOSABS 0.2 12/12/2020 1201   EOSABS 0.0 05/25/2012 1736   BASOSABS 0.1 12/12/2020 1201   BASOSABS 0.1 05/25/2012 1736    Hgb A1C No results found for: "HGBA1C"         Assessment & Plan:   Preventative Health Maintenance:  Encouraged her to get a flu shot in the fall Tetanus UTD Encouraged her to get her COVID-vaccine Pap smear UTD Encouraged her to consume a balanced diet and exercise regimen Advised her to see an eye doctor and dentist annually We will check CBC, c-Met, lipid, A1c today  Discoloration of Left Great Toenail:  Will check c-Met today If liver enzymes normal, will start terbinafine 250 mg daily x 4 weeks Will plan to repeat liver enzymes in 4 weeks  Syncopal Episodes, Chronic Neck Pain, Chronic Thoracic Back Pain:   Will obtain x-ray cervical spine Will obtain x-ray thoracic spine Will obtain MRI brain for further evaluation of syncopal episode  Encounter for Initiation of Injectable Birth Control:  Depo-Provera ordered every 3 months  RTC in 6 months, follow-up chronic conditions Nicki Reaper, NP

## 2023-01-17 LAB — LIPID PANEL
Cholesterol: 138 mg/dL (ref <200)
HDL: 46 mg/dL — ABNORMAL LOW (ref >=50)
LDL Cholesterol (Calc): 83 mg/dL (calc)
Non-HDL Cholesterol (Calc): 92 mg/dL (calc) (ref <130)
Total CHOL/HDL Ratio: 3 (calc) (ref <5.0)
Triglycerides: 31 mg/dL (ref <150)

## 2023-01-17 LAB — TSH: TSH: 1.36 mIU/L

## 2023-01-17 LAB — HEMOGLOBIN A1C
Hgb A1c MFr Bld: 5.6 % of total Hgb (ref <5.7)
Mean Plasma Glucose: 114 mg/dL
eAG (mmol/L): 6.3 mmol/L

## 2023-01-17 LAB — COMPLETE METABOLIC PANEL WITH GFR
AG Ratio: 1.8 (calc) (ref 1.0–2.5)
ALT: 12 U/L (ref 6–29)
AST: 18 U/L (ref 10–30)
Albumin: 4.4 g/dL (ref 3.6–5.1)
Alkaline phosphatase (APISO): 49 U/L (ref 31–125)
BUN: 15 mg/dL (ref 7–25)
CO2: 26 mmol/L (ref 20–32)
Calcium: 9.2 mg/dL (ref 8.6–10.2)
Chloride: 107 mmol/L (ref 98–110)
Creat: 0.81 mg/dL (ref 0.50–0.97)
Globulin: 2.5 g/dL (calc) (ref 1.9–3.7)
Glucose, Bld: 75 mg/dL (ref 65–99)
Potassium: 4.1 mmol/L (ref 3.5–5.3)
Sodium: 141 mmol/L (ref 135–146)
Total Bilirubin: 0.4 mg/dL (ref 0.2–1.2)
Total Protein: 6.9 g/dL (ref 6.1–8.1)
eGFR: 100 mL/min/{1.73_m2} (ref >=60)

## 2023-01-17 LAB — CBC
HCT: 36.4 % (ref 35.0–45.0)
Hemoglobin: 11.5 g/dL — ABNORMAL LOW (ref 11.7–15.5)
MCH: 28.8 pg (ref 27.0–33.0)
MCHC: 31.6 g/dL — ABNORMAL LOW (ref 32.0–36.0)
MCV: 91.2 fL (ref 80.0–100.0)
MPV: 12.3 fL (ref 7.5–12.5)
Platelets: 218 10*3/uL (ref 140–400)
RBC: 3.99 10*6/uL (ref 3.80–5.10)
RDW: 12.3 % (ref 11.0–15.0)
WBC: 6.2 10*3/uL (ref 3.8–10.8)

## 2023-01-26 ENCOUNTER — Ambulatory Visit: Admission: RE | Admit: 2023-01-26 | Payer: Medicaid Other | Source: Ambulatory Visit

## 2023-07-21 ENCOUNTER — Ambulatory Visit: Payer: Medicaid Other | Admitting: Internal Medicine

## 2023-07-21 NOTE — Progress Notes (Unsigned)
HPI  Patient presents to clinic today for 8-month follow-up of chronic conditions.  Anxiety and depression: Chronic, managed on sertraline.  She is not currently seeing a therapist.  She denies SI/HI.  Vagal episodes: Occurs intermittently. This has happened about 8 times throughout the years.  She has never had workup for this in the past.  Past Medical History:  Diagnosis Date   Anxiety    History of fainting spells of unknown cause     Current Outpatient Medications  Medication Sig Dispense Refill   medroxyPROGESTERone (DEPO-PROVERA) 150 MG/ML injection Inject 1 mL (150 mg total) into the muscle every 3 (three) months. 1 mL 1   ondansetron (ZOFRAN-ODT) 4 MG disintegrating tablet Take 1 tablet (4 mg total) by mouth every 8 (eight) hours as needed for nausea or vomiting. 30 tablet 0   sertraline (ZOLOFT) 100 MG tablet Take 1 tablet (100 mg total) by mouth daily. 90 tablet 1   No current facility-administered medications for this visit.    Allergies  Allergen Reactions   Morphine And Codeine Swelling and Rash    Immediate vein hypersensitivity, swelling, redness and rash.     Family History  Problem Relation Age of Onset   Healthy Mother    Testicular cancer Father    Healthy Sister    Healthy Sister    Pancreatic cancer Maternal Grandmother    Depression Maternal Grandfather    Diabetes Maternal Grandfather     Social History   Socioeconomic History   Marital status: Single    Spouse name: Not on file   Number of children: 2   Years of education: Not on file   Highest education level: Not on file  Occupational History   Not on file  Tobacco Use   Smoking status: Former    Current packs/day: 0.50    Types: Cigarettes   Smokeless tobacco: Never  Vaping Use   Vaping status: Every Day   Substances: Nicotine, Flavoring  Substance and Sexual Activity   Alcohol use: Yes    Comment: Social    Drug use: No   Sexual activity: Not Currently  Other Topics Concern    Not on file  Social History Narrative   Not on file   Social Determinants of Health   Financial Resource Strain: Not on file  Food Insecurity: Not on file  Transportation Needs: Not on file  Physical Activity: Not on file  Stress: Not on file  Social Connections: Not on file  Intimate Partner Violence: Not At Risk (07/18/2020)   Humiliation, Afraid, Rape, and Kick questionnaire    Fear of Current or Ex-Partner: No    Emotionally Abused: No    Physically Abused: No    Sexually Abused: No    ROS:  Constitutional: Denies fever, malaise, fatigue, headache or abrupt weight changes.  HEENT: Denies eye pain, eye redness, ear pain, ringing in the ears, wax buildup, runny nose, nasal congestion, bloody nose, or sore throat. Respiratory: Denies difficulty breathing, shortness of breath, cough or sputum production.   Cardiovascular: Denies chest pain, chest tightness, palpitations or swelling in the hands or feet.  Gastrointestinal: Denies abdominal pain, bloating, constipation, diarrhea or blood in the stool.  GU: Denies frequency, urgency, pain with urination, blood in urine, odor or discharge. Musculoskeletal: Denies decrease in range of motion, difficulty with gait, muscle pain or joint pain and swelling.  Skin: Denies redness, rashes, lesions or ulcercations.  Neurological: Denies dizziness, difficulty with memory, difficulty with speech or problems with  balance and coordination.  Psych: Patient has a history of anxiety and depression.  Denies SI/HI.  No other specific complaints in a complete review of systems (except as listed in HPI above).  PE:  There were no vitals taken for this visit. Wt Readings from Last 3 Encounters:  01/16/23 114 lb (51.7 kg)  08/10/22 115 lb (52.2 kg)  07/17/22 118 lb (53.5 kg)    General: Appears her stated age, underweight, in NAD. HEENT: Head: normal shape and size; Eyes: sclera white, no icterus, conjunctiva pink, PERRLA and EOMs intact;   Neck: Neck supple, trachea midline. No masses, lumps or thyromegaly present.  Cardiovascular: Normal rate and rhythm. S1,S2 noted.  No murmur, rubs or gallops noted. No JVD or BLE edema. No carotid bruits noted. Pulmonary/Chest: Normal effort and positive vesicular breath sounds. No respiratory distress. No wheezes, rales or ronchi noted.  Musculoskeletal: No difficulty with gait.  Neurological: Alert and oriented. Coordination normal.  Psychiatric: Mood and affect normal.  Tearful. Judgment and thought content normal.     BMET    Component Value Date/Time   NA 141 01/16/2023 0831   NA 142 08/16/2015 1017   NA 139 12/31/2011 0458   K 4.1 01/16/2023 0831   K 3.8 12/31/2011 0458   CL 107 01/16/2023 0831   CL 108 (H) 12/31/2011 0458   CO2 26 01/16/2023 0831   CO2 20 (L) 12/31/2011 0458   GLUCOSE 75 01/16/2023 0831   GLUCOSE 61 (L) 12/31/2011 0458   BUN 15 01/16/2023 0831   BUN 13 08/16/2015 1017   BUN 8 12/31/2011 0458   CREATININE 0.81 01/16/2023 0831   CALCIUM 9.2 01/16/2023 0831   CALCIUM 8.1 (L) 12/31/2011 0458   GFRNONAA >60 08/10/2022 1056   GFRNONAA >60 12/31/2011 0458   GFRAA >60 12/26/2019 0948   GFRAA >60 12/31/2011 0458    Lipid Panel     Component Value Date/Time   CHOL 138 01/16/2023 0831   TRIG 31 01/16/2023 0831   HDL 46 (L) 01/16/2023 0831   CHOLHDL 3.0 01/16/2023 0831   LDLCALC 83 01/16/2023 0831    CBC    Component Value Date/Time   WBC 6.2 01/16/2023 0831   RBC 3.99 01/16/2023 0831   HGB 11.5 (L) 01/16/2023 0831   HGB 10.5 (L) 12/12/2020 1201   HCT 36.4 01/16/2023 0831   HCT 31.7 (L) 12/12/2020 1201   PLT 218 01/16/2023 0831   PLT 189 12/12/2020 1201   MCV 91.2 01/16/2023 0831   MCV 91 12/12/2020 1201   MCV 88 05/25/2012 1736   MCH 28.8 01/16/2023 0831   MCHC 31.6 (L) 01/16/2023 0831   RDW 12.3 01/16/2023 0831   RDW 12.2 12/12/2020 1201   RDW 12.9 05/25/2012 1736   LYMPHSABS 2.3 12/12/2020 1201   LYMPHSABS 3.5 05/25/2012 1736    MONOABS 0.7 05/25/2012 1736   EOSABS 0.2 12/12/2020 1201   EOSABS 0.0 05/25/2012 1736   BASOSABS 0.1 12/12/2020 1201   BASOSABS 0.1 05/25/2012 1736    Hgb A1C Lab Results  Component Value Date   HGBA1C 5.6 01/16/2023     Assessment and Plan:   RTC in 6 months for annual exam Nicki Reaper, NP

## 2023-09-15 ENCOUNTER — Telehealth: Payer: Medicaid Other | Admitting: Internal Medicine

## 2023-09-15 ENCOUNTER — Encounter: Payer: Self-pay | Admitting: Internal Medicine

## 2023-09-15 DIAGNOSIS — R55 Syncope and collapse: Secondary | ICD-10-CM | POA: Diagnosis not present

## 2023-09-15 DIAGNOSIS — F32A Depression, unspecified: Secondary | ICD-10-CM

## 2023-09-15 DIAGNOSIS — F419 Anxiety disorder, unspecified: Secondary | ICD-10-CM | POA: Diagnosis not present

## 2023-09-15 DIAGNOSIS — R21 Rash and other nonspecific skin eruption: Secondary | ICD-10-CM

## 2023-09-15 MED ORDER — TRIAMCINOLONE ACETONIDE 0.025 % EX OINT: 1.0000 | TOPICAL_OINTMENT | Freq: Two times a day (BID) | CUTANEOUS | 0 refills | Status: DC

## 2023-09-15 NOTE — Progress Notes (Signed)
 Virtual Visit via Video Note  I connected with Tracie Meyer on 09/15/23 at  3:20 PM EST by a video enabled telemedicine application and verified that I am speaking with the correct person using two identifiers.  Location: Patient: Home Provider: Office  Person's participating in this video call: Angeline Laura, NP-C and Murle Hellstrom   I discussed the limitations of evaluation and management by telemedicine and the availability of in person appointments. The patient expressed understanding and agreed to proceed.  History of Present Illness:  Patient due for follow-up of chronic conditions.  Anxiety and Depression: She is not currently taking any medications for this but has been on sertraline  in the past.  She felt like this caused her to have anger outburst.  She is not currently seeing a therapist.  She denies SI/HI.   Vagal episodes: Occurs intermittently, her last episode was about 1.5 months ago.. She has never had workup for this in the past but is interested in seeking further evaluation at this time.  She also reports a rash to her face and the back of her arms.  She reports the rash feels like dry skin.  It does not itch or burn.  She noticed it only after the birth of her child.  She reports the only thing that she can put on her face without it burning is Cetaphil.  She would like a referral to dermatology for further evaluation.  She has no family history of lupus or rosacea.  Past Medical History:  Diagnosis Date   Anxiety    History of fainting spells of unknown cause     Current Outpatient Medications  Medication Sig Dispense Refill   medroxyPROGESTERone  (DEPO-PROVERA ) 150 MG/ML injection Inject 1 mL (150 mg total) into the muscle every 3 (three) months. 1 mL 1   ondansetron  (ZOFRAN -ODT) 4 MG disintegrating tablet Take 1 tablet (4 mg total) by mouth every 8 (eight) hours as needed for nausea or vomiting. 30 tablet 0   sertraline  (ZOLOFT ) 100 MG tablet Take 1 tablet (100  mg total) by mouth daily. 90 tablet 1   No current facility-administered medications for this visit.    Allergies  Allergen Reactions   Morphine  And Codeine Swelling and Rash    Immediate vein hypersensitivity, swelling, redness and rash.     Family History  Problem Relation Age of Onset   Healthy Mother    Testicular cancer Father    Healthy Sister    Healthy Sister    Pancreatic cancer Maternal Grandmother    Depression Maternal Grandfather    Diabetes Maternal Grandfather     Social History   Socioeconomic History   Marital status: Single    Spouse name: Not on file   Number of children: 2   Years of education: Not on file   Highest education level: Not on file  Occupational History   Not on file  Tobacco Use   Smoking status: Former    Current packs/day: 0.50    Types: Cigarettes   Smokeless tobacco: Never  Vaping Use   Vaping status: Every Day   Substances: Nicotine, Flavoring  Substance and Sexual Activity   Alcohol use: Yes    Comment: Social    Drug use: No   Sexual activity: Not Currently  Other Topics Concern   Not on file  Social History Narrative   Not on file   Social Drivers of Health   Financial Resource Strain: Not on file  Food Insecurity: Not on  file  Transportation Needs: Not on file  Physical Activity: Not on file  Stress: Not on file  Social Connections: Not on file  Intimate Partner Violence: Not At Risk (07/18/2020)   Humiliation, Afraid, Rape, and Kick questionnaire    Fear of Current or Ex-Partner: No    Emotionally Abused: No    Physically Abused: No    Sexually Abused: No     Constitutional: Denies fever, malaise, fatigue, headache or abrupt weight changes.  HEENT: Denies eye pain, eye redness, ear pain, ringing in the ears, wax buildup, runny nose, nasal congestion, bloody nose, or sore throat. Respiratory: Denies difficulty breathing, shortness of breath, cough or sputum production.   Cardiovascular: Denies chest pain,  chest tightness, palpitations or swelling in the hands or feet.  Gastrointestinal: Denies abdominal pain, bloating, constipation, diarrhea or blood in the stool.  GU: Denies urgency, frequency, pain with urination, burning sensation, blood in urine, odor or discharge. Musculoskeletal: Denies decrease in range of motion, difficulty with gait, muscle pain or joint pain and swelling.  Skin: Patient reports rash of face and back of arms.  Denies lesions or ulcercations.  Neurological: Patient reports intermittent syncopal episodes.  Denies dizziness, difficulty with memory, difficulty with speech or problems with balance and coordination.  Psych: Patient has a history of anxiety and depression.  Denies SI/HI.  No other specific complaints in a complete review of systems (except as listed in HPI above).  Observations/Objective:   Wt Readings from Last 3 Encounters:  01/16/23 114 lb (51.7 kg)  08/10/22 115 lb (52.2 kg)  07/17/22 118 lb (53.5 kg)    General: Appears her stated age, well developed, well nourished in NAD. Skin: Scattered, raised patches on erythematous base noted of forehead, nose and bilateral cheeks. Pulmonary/Chest: Normal effort. No respiratory distress.  Neurological: Alert and oriented.  Coordination normal.  Psychiatric: Mood and affect normal. Behavior is normal. Judgment and thought content normal.     BMET    Component Value Date/Time   NA 141 01/16/2023 0831   NA 142 08/16/2015 1017   NA 139 12/31/2011 0458   K 4.1 01/16/2023 0831   K 3.8 12/31/2011 0458   CL 107 01/16/2023 0831   CL 108 (H) 12/31/2011 0458   CO2 26 01/16/2023 0831   CO2 20 (L) 12/31/2011 0458   GLUCOSE 75 01/16/2023 0831   GLUCOSE 61 (L) 12/31/2011 0458   BUN 15 01/16/2023 0831   BUN 13 08/16/2015 1017   BUN 8 12/31/2011 0458   CREATININE 0.81 01/16/2023 0831   CALCIUM 9.2 01/16/2023 0831   CALCIUM 8.1 (L) 12/31/2011 0458   GFRNONAA >60 08/10/2022 1056   GFRNONAA >60 12/31/2011 0458    GFRAA >60 12/26/2019 0948   GFRAA >60 12/31/2011 0458    Lipid Panel     Component Value Date/Time   CHOL 138 01/16/2023 0831   TRIG 31 01/16/2023 0831   HDL 46 (L) 01/16/2023 0831   CHOLHDL 3.0 01/16/2023 0831   LDLCALC 83 01/16/2023 0831    CBC    Component Value Date/Time   WBC 6.2 01/16/2023 0831   RBC 3.99 01/16/2023 0831   HGB 11.5 (L) 01/16/2023 0831   HGB 10.5 (L) 12/12/2020 1201   HCT 36.4 01/16/2023 0831   HCT 31.7 (L) 12/12/2020 1201   PLT 218 01/16/2023 0831   PLT 189 12/12/2020 1201   MCV 91.2 01/16/2023 0831   MCV 91 12/12/2020 1201   MCV 88 05/25/2012 1736   MCH 28.8 01/16/2023  0831   MCHC 31.6 (L) 01/16/2023 0831   RDW 12.3 01/16/2023 0831   RDW 12.2 12/12/2020 1201   RDW 12.9 05/25/2012 1736   LYMPHSABS 2.3 12/12/2020 1201   LYMPHSABS 3.5 05/25/2012 1736   MONOABS 0.7 05/25/2012 1736   EOSABS 0.2 12/12/2020 1201   EOSABS 0.0 05/25/2012 1736   BASOSABS 0.1 12/12/2020 1201   BASOSABS 0.1 05/25/2012 1736    Hgb A1C Lab Results  Component Value Date   HGBA1C 5.6 01/16/2023       Assessment and Plan:  Rash:  Rx for triamcinolone  cream 0.025% twice daily as needed for no more than 2 weeks Referral to dermatology for further evaluation Will check ANA, ESR, CRP, C3/C4 complement, anti-double-stranded DNA and antiphospholipid antibodies to rule out lupus  RTC in 4 months for annual exam  Follow Up Instructions:    I discussed the assessment and treatment plan with the patient. The patient was provided an opportunity to ask questions and all were answered. The patient agreed with the plan and demonstrated an understanding of the instructions.   The patient was advised to call back or seek an in-person evaluation if the symptoms worsen or if the condition fails to improve as anticipated.   Angeline Laura, NP

## 2023-09-15 NOTE — Assessment & Plan Note (Signed)
 Referral to cardiology placed.

## 2023-09-15 NOTE — Assessment & Plan Note (Signed)
Did not tolerate sertraline, will discontinue Continue to monitor symptoms for now

## 2023-09-15 NOTE — Patient Instructions (Signed)
Syncope, Adult  Syncope is when you pass out or faint for a short time. It is caused by a sudden decrease in blood flow to the brain. This can happen for many reasons. It can sometimes happen when seeing blood, getting a shot (injection), or having pain or strong emotions. Most causes of fainting are not dangerous, but in some cases it can be a sign of a serious medical problem. If you faint, get help right away. Call your local emergency services (911 in the [place] Follow these instructions at home: Watch for any changes in your symptoms. Take these actions to stay safe and help with your symptoms: Knowing when you may be about to faint Signs that you may be about to faint include: Feeling dizzy or light-headed. It may feel like the room is spinning. Feeling weak. Feeling like you may vomit (nauseous). Seeing spots or seeing all white or all black. Having cold, clammy skin. Feeling warm and sweaty. Hearing ringing in the ears. If you start to feel like you might faint, sit or lie down right away. If sitting, lower your head down between your legs. If lying down, raise (elevate) your feet above the level of your heart. Breathe deeply and steadily. Wait until all of the symptoms are gone. Have someone stay with you until you feel better. Medicines Take over-the-counter and prescription medicines only as told by your doctor. If you are taking blood pressure or heart medicine, sit up and stand up slowly. Spend a few minutes getting ready to sit and then stand. This can help you feel less dizzy. Lifestyle Do not drive, use machinery, or play sports until your doctor says it is okay. Do not drink alcohol. Do not smoke or use any products that contain nicotine or tobacco. If you need help quitting, ask your doctor. Avoid hot tubs and saunas. General instructions Talk with your doctor about your symptoms. You may need to have testing to help find the cause. Drink enough fluid to keep your pee  (urine) pale yellow. Avoid standing for a long time. If you must stand for a long time, do movements such as: Moving your legs. Crossing your legs. Flexing and stretching your leg muscles. Squatting. Keep all follow-up visits. Contact a doctor if: You have episodes of near fainting. Get help right away if: You pass out or faint. You hit your head or are injured after fainting. You have any of these symptoms: Fast or uneven heartbeats (palpitations). Pain in your chest, belly, or back. Shortness of breath. You have jerky movements that you cannot control (seizure). You have a very bad headache. You are confused. You have problems with how you see (vision). You are very weak. You have trouble walking. You are bleeding from your mouth or your butt (rectum). You have black or tarry poop (stool). These symptoms may be an emergency. Get help right away. Call your local emergency services (911 in the [place] Do not wait to see if the symptoms will go away. Do not drive yourself to the hospital. Summary Syncope is when you pass out or faint for a short time. It is caused by a sudden decrease in blood flow to the brain. Signs that you may be about to faint include feeling dizzy or light-headed, feeling like you may vomit, seeing all white or all black, or having cold, clammy skin. If you start to feel like you might faint, sit or lie down right away. Lower your head if sitting, or raise (elevate)   your feet if lying down. Breathe deeply and steadily. Wait until all of the symptoms are gone. This information is not intended to replace advice given to you by your health care provider. Make sure you discuss any questions you have with your health care provider. Document Revised: 12/06/2020 Document Reviewed: 12/06/2020 Elsevier Patient Education  2024 [employer].

## 2023-11-13 ENCOUNTER — Other Ambulatory Visit

## 2023-11-13 DIAGNOSIS — R21 Rash and other nonspecific skin eruption: Secondary | ICD-10-CM

## 2023-11-16 ENCOUNTER — Encounter: Payer: Self-pay | Admitting: Internal Medicine

## 2023-11-16 LAB — C3 AND C4
C3 Complement: 109 mg/dL (ref 83–193)
C4 Complement: 17 mg/dL (ref 15–57)

## 2023-11-16 LAB — ANTIPHOSPHOLOPID AB PANEL
Anticardiolipin IgA: <2 [APL'U]/mL (ref <20.0)
Anticardiolipin IgG: <2 [GPL'U]/mL (ref <20.0)
Anticardiolipin IgM: 2.5 [MPL'U]/mL (ref <20.0)
Beta-2 Glyco 1 IgA: <2 U/mL (ref <20.0)
Beta-2 Glyco 1 IgM: <2 U/mL (ref <20.0)
Beta-2 Glyco I IgG: <2 U/mL (ref <20.0)
Phosphatidylserine/Prothrombin Ab (IgG): <9 U (ref <=30)
Phosphatidylserine/Prothrombin Ab (IgM): 14 U (ref <=30)

## 2023-11-16 LAB — C-REACTIVE PROTEIN: CRP: <3 mg/L (ref <8.0)

## 2023-11-16 LAB — ANTI-DNA ANTIBODY, DOUBLE-STRANDED: ds DNA Ab: <1 [IU]/mL

## 2023-11-16 LAB — ANA: Anti Nuclear Antibody (ANA): NEGATIVE

## 2023-11-16 LAB — SEDIMENTATION RATE: Sed Rate: 2 mm/h (ref 0–20)

## 2023-12-01 ENCOUNTER — Ambulatory Visit: Admitting: Internal Medicine

## 2023-12-01 ENCOUNTER — Encounter: Payer: Self-pay | Admitting: Internal Medicine

## 2023-12-01 VITALS — BP 98/64 | Ht 67.0 in | Wt 114.6 lb

## 2023-12-01 DIAGNOSIS — B029 Zoster without complications: Secondary | ICD-10-CM | POA: Diagnosis not present

## 2023-12-01 MED ORDER — VALACYCLOVIR HCL 1 G PO TABS: 1000.0000 mg | ORAL_TABLET | Freq: Three times a day (TID) | ORAL | 0 refills | Status: AC

## 2023-12-01 NOTE — Patient Instructions (Signed)
 Shingles  Shingles, or herpes zoster, is an infection. It gives you a skin rash and blisters. These infected areas may hurt a lot. Shingles only happens if: You've had chickenpox. You've been given a shot called a vaccine to protect you from getting chickenpox. Shingles is rare in this case. What are the causes? Shingles is caused by a germ called the varicella-zoster virus. This is the same germ that causes chickenpox. After you're exposed to the germ, it stays in your body but is dormant. This means it isn't active. Shingles happens if the germ becomes active again. This can happen years after you're first exposed to the germ. What increases the risk? You may be more likely to get shingles if: You're older than 31 years of age. You're under a lot of stress. You have a weak immune system. The immune system is your body's defense system. It may be weak if: You have human immunodeficiency virus (HIV). You have acquired immunodeficiency syndrome (AIDS). You have cancer. You take medicines that weaken your immune system. These include organ transplant medicines. What are the signs or symptoms? The first symptoms of shingles may be itching, tingling, or pain. Your skin may feel like it's burning. A few days or weeks later, you'll get a rash. Here's what you can expect: The rash is likely to be on one side of your body. The rash may be shaped like a belt or a band. Over time, it will turn into blisters filled with fluid. The blisters will break open and change into scabs. The scabs will dry up in about 2-3 weeks. You may also have: A fever. Chills. A headache. Nausea. How is this diagnosed? Shingles is diagnosed with a skin exam. A sample called a culture may be taken from one of your blisters and sent to a lab. This will show if you have shingles. How is this treated? The rash may last for several weeks. There's no cure for shingles, but your health care provider may give you medicines.  These medicines may: Help with pain. Help with itching. Help with irritation and swelling. Help you get better sooner. Help to prevent long-term problems. If the rash is on your face, you may need to see an eye doctor or an ear, nose, and throat (ENT) doctor. Follow these instructions at home: Medicines Take your medicines only as told by your provider. Put an anti-itch cream or numbing cream on the rash or blisters as told by your provider. Relieving itching and discomfort  To help with itching: Put cold, wet cloths called cold compresses on the rash or blisters. Take a cool bath. Try adding baking soda or dry oatmeal to the water. Do not bathe in hot water. Use calamine lotion on the rash or blisters. You can get this type of lotion at the store. Blister and rash care Keep your rash covered with a loose bandage. Wear loose clothes that don't rub on your rash. Take care of your rash as told by your provider. Make sure you: Wash your hands with soap and water for at least 20 seconds before and after you change your bandage. If you can't use soap and water, use hand sanitizer. Keep your rash and blisters clean by washing them with mild soap and cool water. Change your bandage. Check your rash every day for signs of infection. Check for: More redness, swelling, or pain. Fluid or blood. Warmth. Pus or a bad smell. Do not scratch your rash. Do not pick at your  blisters. To help you not scratch: Keep your fingernails clean and cut short. Try to wear gloves or mittens when you sleep. General instructions Rest. Wash your hands often with soap and water for at least 20 seconds. If you can't use soap and water, use hand sanitizer. Washing your hands lowers your chance of getting a skin infection. Your infection can cause chickenpox in others. If you have blisters that aren't scabs yet, stay away from: Babies. Pregnant people. Children who have eczema. Older people who have organ  transplants. People who have a long-term, or chronic, illness. Anyone who hasn't had chickenpox before. Anyone who hasn't gotten the chickenpox vaccine. How is this prevented? Vaccines are the best way to prevent you from getting chickenpox or shingles. Talk with your provider about getting these shots. Where to find more information Centers for Disease Control and Prevention (CDC): [employer] Contact a health care provider if: Your pain doesn't get better with medicine. Your pain doesn't get better after the rash heals. You have any signs of infection around the rash. Your rash or blisters get worse. You have a fever or chills. Get help right away if: The rash is on your face or nose. You have pain in your face or by your eye. You lose feeling on one side of your face. You have trouble seeing. You have ear pain or ringing in your ear. This information is not intended to replace advice given to you by your health care provider. Make sure you discuss any questions you have with your health care provider. Document Revised: 04/30/2023 Document Reviewed: 09/12/2022 Elsevier Patient Education  2024 [employer].

## 2023-12-01 NOTE — Progress Notes (Signed)
 Subjective:    Patient ID: Tracie Meyer, female    DOB: 09/01/1990, 33 y.o.   MRN: [id number]  HPI  Discussed the use of AI scribe software for clinical note transcription with the patient, who gave verbal consent to proceed.  Tracie Meyer is a 33 year old female who presents with painful bumps on her bottom and vaginal area.  The patient noticed the onset of painful bumps over the last three to four days, initially appearing on her bottom and later on her vaginal lips. The bumps are painful, but there is no associated itching, burning, or tingling sensations. She has never experienced similar symptoms before.  She is sexually active and in a monogamous relationship with her fianc for four years. There is no known history of herpes for either partner. She is concerned about potential bleeding during intercourse and has decided to abstain from sexual activity until the condition resolves.  She reports significant pain associated with the bumps, which she noticed due to the pain rather than visual observation. Additionally, she mentions swelling and pain in her lymph nodes.    Review of Systems   Past Medical History:  Diagnosis Meyer   Anxiety    History of fainting spells of unknown cause     Current Outpatient Medications  Medication Sig Dispense Refill   medroxyPROGESTERone  (DEPO-PROVERA ) 150 MG/ML injection Inject 1 mL (150 mg total) into the muscle every 3 (three) months. 1 mL 1   ondansetron  (ZOFRAN -ODT) 4 MG disintegrating tablet Take 1 tablet (4 mg total) by mouth every 8 (eight) hours as needed for nausea or vomiting. 30 tablet 0   triamcinolone  (KENALOG ) 0.025 % ointment Apply 1 Application topically 2 (two) times daily. 30 g 0   No current facility-administered medications for this visit.    Allergies  Allergen Reactions   Morphine  And Codeine Swelling and Rash    Immediate vein hypersensitivity, swelling, redness and rash.     Family History  Problem  Relation Age of Onset   Healthy Mother    Testicular cancer Father    Healthy Sister    Healthy Sister    Pancreatic cancer Maternal Grandmother    Depression Maternal Grandfather    Diabetes Maternal Grandfather     Social History   Socioeconomic History   Marital status: Single    Spouse name: Not on file   Number of children: 2   Years of education: Not on file   Highest education level: Not on file  Occupational History   Not on file  Tobacco Use   Smoking status: Former    Current packs/day: 0.50    Types: Cigarettes   Smokeless tobacco: Never  Vaping Use   Vaping status: Every Day   Substances: Nicotine, Flavoring  Substance and Sexual Activity   Alcohol use: Yes    Comment: Social    Drug use: No   Sexual activity: Not Currently  Other Topics Concern   Not on file  Social History Narrative   Not on file   Social Drivers of Health   Financial Resource Strain: Not on file  Food Insecurity: Not on file  Transportation Needs: Not on file  Physical Activity: Not on file  Stress: Not on file  Social Connections: Not on file  Intimate Partner Violence: Not At Risk (07/18/2020)   Humiliation, Afraid, Rape, and Kick questionnaire    Fear of Current or Ex-Partner: No    Emotionally Abused: No    Physically Abused:  No    Sexually Abused: No     Constitutional: Denies fever, malaise, fatigue, headache or abrupt weight changes.  Respiratory: Denies difficulty breathing, shortness of breath, cough or sputum production.   Cardiovascular: Denies chest pain, chest tightness, palpitations or swelling in the hands or feet.  Skin: Patient reports rash of buttock and vaginal area.  Denies ulcercations.   No other specific complaints in a complete review of systems (except as listed in HPI above).      Objective:   Physical Exam  BP 98/64 (BP Location: Left Arm, Patient Position: Sitting, Cuff Size: Normal)   Ht 5\' 7"  (1.702 m)   Wt 114 lb 9.6 oz (52 kg)   BMI  17.95 kg/m   Wt Readings from Last 3 Encounters:  01/16/23 114 lb (51.7 kg)  08/10/22 115 lb (52.2 kg)  07/17/22 118 lb (53.5 kg)    General: Appears her stated age, underweight, in NAD. Skin: Grouped, scabbed rash noted of right buttock and right labia. Cardiovascular: Normal rate. Pulmonary/Chest: Normal effort and positive vesicular breath sounds. No respiratory distress. No wheezes, rales or ronchi noted.  Musculoskeletal:  No difficulty with gait.  Neurological: Alert and oriented.   BMET    Component Value Meyer/Time   NA 141 01/16/2023 0831   NA 142 08/16/2015 1017   NA 139 12/31/2011 0458   K 4.1 01/16/2023 0831   K 3.8 12/31/2011 0458   CL 107 01/16/2023 0831   CL 108 (H) 12/31/2011 0458   CO2 26 01/16/2023 0831   CO2 20 (L) 12/31/2011 0458   GLUCOSE 75 01/16/2023 0831   GLUCOSE 61 (L) 12/31/2011 0458   BUN 15 01/16/2023 0831   BUN 13 08/16/2015 1017   BUN 8 12/31/2011 0458   CREATININE 0.81 01/16/2023 0831   CALCIUM 9.2 01/16/2023 0831   CALCIUM 8.1 (L) 12/31/2011 0458   GFRNONAA >60 08/10/2022 1056   GFRNONAA >60 12/31/2011 0458   GFRAA >60 12/26/2019 0948   GFRAA >60 12/31/2011 0458    Lipid Panel     Component Value Meyer/Time   CHOL 138 01/16/2023 0831   TRIG 31 01/16/2023 0831   HDL 46 (L) 01/16/2023 0831   CHOLHDL 3.0 01/16/2023 0831   LDLCALC 83 01/16/2023 0831    CBC    Component Value Meyer/Time   WBC 6.2 01/16/2023 0831   RBC 3.99 01/16/2023 0831   HGB 11.5 (L) 01/16/2023 0831   HGB 10.5 (L) 12/12/2020 1201   HCT 36.4 01/16/2023 0831   HCT 31.7 (L) 12/12/2020 1201   PLT 218 01/16/2023 0831   PLT 189 12/12/2020 1201   MCV 91.2 01/16/2023 0831   MCV 91 12/12/2020 1201   MCV 88 05/25/2012 1736   MCH 28.8 01/16/2023 0831   MCHC 31.6 (L) 01/16/2023 0831   RDW 12.3 01/16/2023 0831   RDW 12.2 12/12/2020 1201   RDW 12.9 05/25/2012 1736   LYMPHSABS 2.3 12/12/2020 1201   LYMPHSABS 3.5 05/25/2012 1736   MONOABS 0.7 05/25/2012 1736    EOSABS 0.2 12/12/2020 1201   EOSABS 0.0 05/25/2012 1736   BASOSABS 0.1 12/12/2020 1201   BASOSABS 0.1 05/25/2012 1736    Hgb A1C Lab Results  Component Value Meyer   HGBA1C 5.6 01/16/2023            Assessment & Plan:   Assessment and Plan    Shingles Acute shingles with painful vesicular lesions on genital area and lips. Differential diagnosis included herpes, but presentation consistent with shingles. Shingles  due to reactivation of varicella-zoster virus. Advised to avoid contact with vulnerable individuals during active phase. - Prescribe valacyclovir  1 tablet three times daily for 7 days. - Advise against sexual activity until lesions are completely healed.     RTC in 2 months for your annual exam Helayne Lo, NP

## 2024-01-11 ENCOUNTER — Ambulatory Visit: Attending: Cardiology | Admitting: Cardiology
